# Patient Record
Sex: Female | Born: 1985
Health system: Southern US, Community
[De-identification: ages and names within clinical notes are randomized; demographics above are authoritative.]

## PROBLEM LIST (undated history)

## (undated) DIAGNOSIS — O321XX0 Maternal care for breech presentation, not applicable or unspecified: Secondary | ICD-10-CM

## (undated) DIAGNOSIS — R87629 Unspecified abnormal cytological findings in specimens from vagina: Secondary | ICD-10-CM

## (undated) DIAGNOSIS — D573 Sickle-cell trait: Secondary | ICD-10-CM

## (undated) DIAGNOSIS — F419 Anxiety disorder, unspecified: Secondary | ICD-10-CM

## (undated) HISTORY — PX: HERNIA REPAIR: SHX51

## (undated) HISTORY — PX: WISDOM TOOTH EXTRACTION: SHX21

## (undated) HISTORY — PX: COLPOSCOPY: SHX161

## (undated) HISTORY — PX: LEEP: SHX91

---

## 2016-10-02 DIAGNOSIS — Z3201 Encounter for pregnancy test, result positive: Secondary | ICD-10-CM | POA: Diagnosis not present

## 2016-11-11 DIAGNOSIS — Z3401 Encounter for supervision of normal first pregnancy, first trimester: Secondary | ICD-10-CM | POA: Diagnosis not present

## 2016-11-11 DIAGNOSIS — Z3689 Encounter for other specified antenatal screening: Secondary | ICD-10-CM | POA: Diagnosis not present

## 2016-11-11 DIAGNOSIS — Z113 Encounter for screening for infections with a predominantly sexual mode of transmission: Secondary | ICD-10-CM | POA: Diagnosis not present

## 2016-11-11 DIAGNOSIS — O3441 Maternal care for other abnormalities of cervix, first trimester: Secondary | ICD-10-CM | POA: Diagnosis not present

## 2016-11-11 LAB — OB RESULTS CONSOLE GC/CHLAMYDIA
Chlamydia: NEGATIVE
Gonorrhea: NEGATIVE

## 2016-11-11 LAB — OB RESULTS CONSOLE ABO/RH: RH Type: POSITIVE

## 2016-11-11 LAB — OB RESULTS CONSOLE HEPATITIS B SURFACE ANTIGEN: HEP B S AG: NEGATIVE

## 2016-11-11 LAB — OB RESULTS CONSOLE HIV ANTIBODY (ROUTINE TESTING): HIV: NONREACTIVE

## 2016-11-11 LAB — OB RESULTS CONSOLE RPR: RPR: NONREACTIVE

## 2016-11-11 LAB — OB RESULTS CONSOLE RUBELLA ANTIBODY, IGM: RUBELLA: IMMUNE

## 2016-11-11 LAB — OB RESULTS CONSOLE ANTIBODY SCREEN: Antibody Screen: NEGATIVE

## 2016-11-30 DIAGNOSIS — O3442 Maternal care for other abnormalities of cervix, second trimester: Secondary | ICD-10-CM | POA: Diagnosis not present

## 2016-11-30 DIAGNOSIS — Z3682 Encounter for antenatal screening for nuchal translucency: Secondary | ICD-10-CM | POA: Diagnosis not present

## 2016-11-30 DIAGNOSIS — Z3A13 13 weeks gestation of pregnancy: Secondary | ICD-10-CM | POA: Diagnosis not present

## 2016-11-30 DIAGNOSIS — Z9889 Other specified postprocedural states: Secondary | ICD-10-CM | POA: Diagnosis not present

## 2016-12-21 DIAGNOSIS — Z361 Encounter for antenatal screening for raised alphafetoprotein level: Secondary | ICD-10-CM | POA: Diagnosis not present

## 2016-12-21 DIAGNOSIS — Z3A16 16 weeks gestation of pregnancy: Secondary | ICD-10-CM | POA: Diagnosis not present

## 2016-12-21 DIAGNOSIS — O3442 Maternal care for other abnormalities of cervix, second trimester: Secondary | ICD-10-CM | POA: Diagnosis not present

## 2017-01-05 DIAGNOSIS — Z3A18 18 weeks gestation of pregnancy: Secondary | ICD-10-CM | POA: Diagnosis not present

## 2017-01-05 DIAGNOSIS — O26892 Other specified pregnancy related conditions, second trimester: Secondary | ICD-10-CM | POA: Diagnosis not present

## 2017-01-12 DIAGNOSIS — Z3686 Encounter for antenatal screening for cervical length: Secondary | ICD-10-CM | POA: Diagnosis not present

## 2017-01-12 DIAGNOSIS — O3442 Maternal care for other abnormalities of cervix, second trimester: Secondary | ICD-10-CM | POA: Diagnosis not present

## 2017-01-12 DIAGNOSIS — Z3A19 19 weeks gestation of pregnancy: Secondary | ICD-10-CM | POA: Diagnosis not present

## 2017-01-12 DIAGNOSIS — Z363 Encounter for antenatal screening for malformations: Secondary | ICD-10-CM | POA: Diagnosis not present

## 2017-02-10 DIAGNOSIS — O3442 Maternal care for other abnormalities of cervix, second trimester: Secondary | ICD-10-CM | POA: Diagnosis not present

## 2017-02-10 DIAGNOSIS — Z3A23 23 weeks gestation of pregnancy: Secondary | ICD-10-CM | POA: Diagnosis not present

## 2017-02-16 DIAGNOSIS — Z043 Encounter for examination and observation following other accident: Secondary | ICD-10-CM | POA: Diagnosis not present

## 2017-02-23 DIAGNOSIS — Z3A25 25 weeks gestation of pregnancy: Secondary | ICD-10-CM | POA: Diagnosis not present

## 2017-02-23 DIAGNOSIS — O3442 Maternal care for other abnormalities of cervix, second trimester: Secondary | ICD-10-CM | POA: Diagnosis not present

## 2017-03-03 DIAGNOSIS — O3442 Maternal care for other abnormalities of cervix, second trimester: Secondary | ICD-10-CM | POA: Diagnosis not present

## 2017-03-03 DIAGNOSIS — Z3A26 26 weeks gestation of pregnancy: Secondary | ICD-10-CM | POA: Diagnosis not present

## 2017-03-18 DIAGNOSIS — Z23 Encounter for immunization: Secondary | ICD-10-CM | POA: Diagnosis not present

## 2017-03-18 DIAGNOSIS — O3443 Maternal care for other abnormalities of cervix, third trimester: Secondary | ICD-10-CM | POA: Diagnosis not present

## 2017-03-18 DIAGNOSIS — Z3689 Encounter for other specified antenatal screening: Secondary | ICD-10-CM | POA: Diagnosis not present

## 2017-03-18 DIAGNOSIS — Z3A28 28 weeks gestation of pregnancy: Secondary | ICD-10-CM | POA: Diagnosis not present

## 2017-03-30 DIAGNOSIS — Z3A30 30 weeks gestation of pregnancy: Secondary | ICD-10-CM | POA: Diagnosis not present

## 2017-03-30 DIAGNOSIS — O3443 Maternal care for other abnormalities of cervix, third trimester: Secondary | ICD-10-CM | POA: Diagnosis not present

## 2017-04-14 DIAGNOSIS — O321XX0 Maternal care for breech presentation, not applicable or unspecified: Secondary | ICD-10-CM | POA: Diagnosis not present

## 2017-04-14 DIAGNOSIS — Z3A32 32 weeks gestation of pregnancy: Secondary | ICD-10-CM | POA: Diagnosis not present

## 2017-04-28 DIAGNOSIS — O321XX0 Maternal care for breech presentation, not applicable or unspecified: Secondary | ICD-10-CM | POA: Diagnosis not present

## 2017-04-28 DIAGNOSIS — Z3A34 34 weeks gestation of pregnancy: Secondary | ICD-10-CM | POA: Diagnosis not present

## 2017-05-17 DIAGNOSIS — Z3685 Encounter for antenatal screening for Streptococcus B: Secondary | ICD-10-CM | POA: Diagnosis not present

## 2017-05-17 DIAGNOSIS — Z3A37 37 weeks gestation of pregnancy: Secondary | ICD-10-CM | POA: Diagnosis not present

## 2017-05-17 DIAGNOSIS — O321XX0 Maternal care for breech presentation, not applicable or unspecified: Secondary | ICD-10-CM | POA: Diagnosis not present

## 2017-05-17 DIAGNOSIS — Z23 Encounter for immunization: Secondary | ICD-10-CM | POA: Diagnosis not present

## 2017-05-20 ENCOUNTER — Other Ambulatory Visit: Payer: Self-pay | Admitting: Obstetrics & Gynecology

## 2017-05-21 ENCOUNTER — Encounter (HOSPITAL_COMMUNITY): Payer: Self-pay | Admitting: *Deleted

## 2017-05-26 DIAGNOSIS — Z3A38 38 weeks gestation of pregnancy: Secondary | ICD-10-CM | POA: Diagnosis not present

## 2017-05-26 DIAGNOSIS — O321XX0 Maternal care for breech presentation, not applicable or unspecified: Secondary | ICD-10-CM | POA: Diagnosis not present

## 2017-05-28 ENCOUNTER — Encounter (HOSPITAL_COMMUNITY)
Admission: RE | Admit: 2017-05-28 | Discharge: 2017-05-28 | Disposition: A | Discharge status: Home / Self Care | Payer: Federal, State, Local not specified - PPO | Source: Ambulatory Visit | Attending: Obstetrics and Gynecology | Admitting: Obstetrics and Gynecology

## 2017-05-28 HISTORY — DX: Sickle-cell trait: D57.3

## 2017-05-28 HISTORY — DX: Unspecified abnormal cytological findings in specimens from vagina: R87.629

## 2017-05-31 ENCOUNTER — Encounter (HOSPITAL_COMMUNITY)
Admission: RE | Admit: 2017-05-31 | Discharge: 2017-05-31 | Disposition: A | Discharge status: Home / Self Care | Payer: Federal, State, Local not specified - PPO | Source: Ambulatory Visit | Attending: Obstetrics and Gynecology | Admitting: Obstetrics and Gynecology

## 2017-05-31 DIAGNOSIS — Z3A39 39 weeks gestation of pregnancy: Secondary | ICD-10-CM | POA: Diagnosis not present

## 2017-05-31 DIAGNOSIS — O9902 Anemia complicating childbirth: Secondary | ICD-10-CM | POA: Diagnosis not present

## 2017-05-31 DIAGNOSIS — D573 Sickle-cell trait: Secondary | ICD-10-CM | POA: Diagnosis not present

## 2017-05-31 DIAGNOSIS — O321XX0 Maternal care for breech presentation, not applicable or unspecified: Secondary | ICD-10-CM | POA: Diagnosis not present

## 2017-05-31 LAB — CBC
HEMATOCRIT: 36.2 % (ref 36.0–46.0)
HEMOGLOBIN: 13.8 g/dL (ref 12.0–15.0)
MCH: 31.5 pg (ref 26.0–34.0)
MCHC: 38.1 g/dL — AB (ref 30.0–36.0)
MCV: 82.6 fL (ref 78.0–100.0)
Platelets: 299 10*3/uL (ref 150–400)
RBC: 4.38 MIL/uL (ref 3.87–5.11)
RDW: 13 % (ref 11.5–15.5)
WBC: 11.4 10*3/uL — ABNORMAL HIGH (ref 4.0–10.5)

## 2017-05-31 LAB — ABO/RH: ABO/RH(D): B POS

## 2017-05-31 LAB — TYPE AND SCREEN
ABO/RH(D): B POS
Antibody Screen: NEGATIVE

## 2017-05-31 NOTE — H&P (Signed)
Denise Mcdaniel is a 31 y.o. female presenting for primary C-section for persistent Breech. 39.2 wks. Slight spotting 10/29. No other complaints.  Uncomplicated pregnancy, LEEP hx, cervix <3 cm at anatomy sono followed up and remained stable.  SS trait. FOB clear.   OB History    Gravida Para Term Preterm AB Living   3 0     2     SAB TAB Ectopic Multiple Live Births     2           Past Medical History:  Diagnosis Date  . Sickle cell trait syndrome (HCC)    is sickle cell S trait  . Vaginal Pap smear, abnormal    Past Surgical History:  Procedure Laterality Date  . HERNIA REPAIR    . LEEP    . WISDOM TOOTH EXTRACTION     Family History: family history includes Diabetes in her maternal grandfather and maternal grandmother. Social History:  reports that she has never smoked. She has never used smokeless tobacco. She reports that she does not drink alcohol or use drugs.     Maternal Diabetes: No Genetic Screening: Normal Maternal Ultrasounds/Referrals: Normal Fetal Ultrasounds or other Referrals:  None Maternal Substance Abuse:  No Significant Maternal Medications:  None Significant Maternal Lab Results:  Lab values include: Group B Strep negative Other Comments:  None  ROS History   There were no vitals taken for this visit. Exam Physical Exam  BP 127/83   Pulse 100   Temp 98.8 F (37.1 C) (Oral)   Resp 18   Ht 5\' 8"  (1.727 m)   Wt 178 lb (80.7 kg)   BMI 27.06 kg/m   Physical exam:  A&O x 3, no acute distress. Pleasant HEENT neg, no thyromegaly Lungs CTA bilat CV RRR, S1S2 normal Abdo soft, non tender, non acute Extr no edema/ tenderness Pelvic def FHT  140s Toco none  Prenatal labs: ABO, Rh: --/--/B POS (10/29 16100949) Antibody: NEG (10/29 [id number]) Rubella: Immune (04/11 0000) RPR: Nonreactive (04/11 0000)  HBsAg: Negative (04/11 0000)  HIV: Non-reactive (04/11 0000)  GBS:   negative  Assessment/Plan: 31 yo G3P0, 39.2 wks. Breech. Declines version.  Here for C-section Risks/complications of surgery reviewed incl infection, bleeding, damage to internal organs including bladder, bowels, ureters, blood vessels, other risks from anesthesia, VTE and delayed complications of any surgery, complications in future surgery reviewed. Also discussed neonatal complications incl difficult delivery, laceration, vacuum assistance, TTN etc. Pt understands and agrees, all concerns addressed.      Amel Kitch R 05/31/2017, 1:32 PM

## 2017-05-31 NOTE — Patient Instructions (Signed)
Lauretta Grillaylor H Dinino  05/31/2017   Your procedure is scheduled on:  10.30.2018  [facility] through the [facility]tr[facility] at 0900 AM.  Pick up the phone at the desk and dial [phone]  Call this number if you have problems the morning of surgery:[phone]  Remember:   Do not eat food:After Midnight.  Do not drink clear liquids: After Midnight.  Take these medicines the morning of surgery with A SIP OF WATER: none   Do not wear jewelry, make-up or nail polish.  Do not wear lotions, powders, or perfumes. Do not wear deodorant.  Do not shave 48 hours prior to surgery.  Do not bring valuables to the hospital.  [facility] is not   responsible for any belongings or valuables brought to the hospital.  Contacts, dentures or bridgework may not be worn into surgery.  Leave suitcase in the car. After surgery it may be brought to your room.  For patients admitted to the hospital, checkout time is 11:00 AM the day of              discharge.    N/A   Please read over the following fact sheets that you were given:   Surgical Site Infection Prevention

## 2017-06-01 ENCOUNTER — Encounter (HOSPITAL_COMMUNITY): Payer: Self-pay | Admitting: *Deleted

## 2017-06-01 ENCOUNTER — Inpatient Hospital Stay (HOSPITAL_COMMUNITY)
Admission: AD | Admit: 2017-06-01 | Discharge: 2017-06-04 | DRG: 788 | Disposition: A | Discharge status: Home / Self Care | Payer: Federal, State, Local not specified - PPO | Source: Ambulatory Visit | Attending: Obstetrics & Gynecology | Admitting: Obstetrics & Gynecology

## 2017-06-01 ENCOUNTER — Inpatient Hospital Stay (HOSPITAL_COMMUNITY): Payer: Federal, State, Local not specified - PPO

## 2017-06-01 ENCOUNTER — Encounter (HOSPITAL_COMMUNITY)
Admission: AD | Disposition: A | Discharge status: Home / Self Care | Payer: Self-pay | Source: Ambulatory Visit | Attending: Obstetrics & Gynecology

## 2017-06-01 DIAGNOSIS — D573 Sickle-cell trait: Secondary | ICD-10-CM | POA: Diagnosis present

## 2017-06-01 DIAGNOSIS — Z3A39 39 weeks gestation of pregnancy: Secondary | ICD-10-CM

## 2017-06-01 DIAGNOSIS — O9902 Anemia complicating childbirth: Secondary | ICD-10-CM | POA: Diagnosis present

## 2017-06-01 DIAGNOSIS — O321XX0 Maternal care for breech presentation, not applicable or unspecified: Secondary | ICD-10-CM | POA: Diagnosis not present

## 2017-06-01 HISTORY — DX: Maternal care for breech presentation, not applicable or unspecified: O32.1XX0

## 2017-06-01 LAB — RPR: RPR Ser Ql: NONREACTIVE

## 2017-06-01 SURGERY — Surgical Case
Anesthesia: Spinal

## 2017-06-01 MED ORDER — NALBUPHINE HCL 10 MG/ML IJ SOLN: 5.0000 mg | INTRAMUSCULAR | Status: DC | PRN

## 2017-06-01 MED ORDER — ONDANSETRON HCL 4 MG/2ML IJ SOLN
INTRAMUSCULAR | Status: AC
  Filled 2017-06-01: qty 2

## 2017-06-01 MED ORDER — LACTATED RINGERS IV SOLN: INTRAVENOUS | Status: DC

## 2017-06-01 MED ORDER — ZOLPIDEM TARTRATE 5 MG PO TABS: 5.0000 mg | ORAL_TABLET | Freq: Every evening | ORAL | Status: DC | PRN

## 2017-06-01 MED ORDER — ACETAMINOPHEN 325 MG PO TABS
650.0000 mg | ORAL_TABLET | ORAL | Status: DC | PRN
  Administered 2017-06-02 – 2017-06-04 (×5): 650 mg via ORAL
  Filled 2017-06-01 (×6): qty 2

## 2017-06-01 MED ORDER — NALBUPHINE HCL 10 MG/ML IJ SOLN: 5.0000 mg | Freq: Once | INTRAMUSCULAR | Status: DC | PRN

## 2017-06-01 MED ORDER — OXYTOCIN 10 UNIT/ML IJ SOLN
INTRAMUSCULAR | Status: AC
  Filled 2017-06-01: qty 4

## 2017-06-01 MED ORDER — BUPIVACAINE IN DEXTROSE 0.75-8.25 % IT SOLN
INTRATHECAL | Status: DC | PRN
  Administered 2017-06-01: 1.4 mL via INTRATHECAL

## 2017-06-01 MED ORDER — NALOXONE HCL 2 MG/2ML IJ SOSY
1.0000 ug/kg/h | PREFILLED_SYRINGE | INTRAVENOUS | Status: DC | PRN
  Filled 2017-06-01: qty 2

## 2017-06-01 MED ORDER — LACTATED RINGERS IV SOLN
INTRAVENOUS | Status: DC
  Administered 2017-06-01 (×4): via INTRAVENOUS

## 2017-06-01 MED ORDER — MEPERIDINE HCL 25 MG/ML IJ SOLN: 6.2500 mg | INTRAMUSCULAR | Status: DC | PRN

## 2017-06-01 MED ORDER — PHENYLEPHRINE 8 MG IN D5W 100 ML (0.08MG/ML) PREMIX OPTIME
INJECTION | INTRAVENOUS | Status: DC | PRN
  Administered 2017-06-01: 60 ug/min via INTRAVENOUS

## 2017-06-01 MED ORDER — GENTAMICIN SULFATE 40 MG/ML IJ SOLN
INTRAMUSCULAR | Status: AC
  Administered 2017-06-01: 100 mL via INTRAVENOUS
  Filled 2017-06-01: qty 8.75

## 2017-06-01 MED ORDER — NALOXONE HCL 0.4 MG/ML IJ SOLN: 0.4000 mg | INTRAMUSCULAR | Status: DC | PRN

## 2017-06-01 MED ORDER — SIMETHICONE 80 MG PO CHEW
80.0000 mg | CHEWABLE_TABLET | Freq: Three times a day (TID) | ORAL | Status: DC
  Administered 2017-06-01 – 2017-06-04 (×7): 80 mg via ORAL
  Filled 2017-06-01 (×7): qty 1

## 2017-06-01 MED ORDER — SCOPOLAMINE 1 MG/3DAYS TD PT72: 1.0000 | MEDICATED_PATCH | Freq: Once | TRANSDERMAL | Status: DC

## 2017-06-01 MED ORDER — FENTANYL CITRATE (PF) 100 MCG/2ML IJ SOLN
INTRAMUSCULAR | Status: DC | PRN
  Administered 2017-06-01: 20 ug via INTRATHECAL

## 2017-06-01 MED ORDER — DEXAMETHASONE SODIUM PHOSPHATE 4 MG/ML IJ SOLN
INTRAMUSCULAR | Status: AC
  Filled 2017-06-01: qty 1

## 2017-06-01 MED ORDER — DIPHENHYDRAMINE HCL 25 MG PO CAPS: 25.0000 mg | ORAL_CAPSULE | Freq: Four times a day (QID) | ORAL | Status: DC | PRN

## 2017-06-01 MED ORDER — KETOROLAC TROMETHAMINE 30 MG/ML IJ SOLN
INTRAMUSCULAR | Status: AC
  Administered 2017-06-01: 30 mg
  Filled 2017-06-01: qty 1

## 2017-06-01 MED ORDER — DIPHENHYDRAMINE HCL 25 MG PO CAPS: 25.0000 mg | ORAL_CAPSULE | ORAL | Status: DC | PRN

## 2017-06-01 MED ORDER — DEXAMETHASONE SODIUM PHOSPHATE 4 MG/ML IJ SOLN
INTRAMUSCULAR | Status: DC | PRN
  Administered 2017-06-01: 4 mg via INTRAVENOUS

## 2017-06-01 MED ORDER — MORPHINE SULFATE (PF) 0.5 MG/ML IJ SOLN
INTRAMUSCULAR | Status: DC | PRN
  Administered 2017-06-01: .2 ug via INTRATHECAL

## 2017-06-01 MED ORDER — HYDROMORPHONE HCL 1 MG/ML IJ SOLN: 0.2500 mg | INTRAMUSCULAR | Status: DC | PRN

## 2017-06-01 MED ORDER — SENNOSIDES-DOCUSATE SODIUM 8.6-50 MG PO TABS
2.0000 | ORAL_TABLET | ORAL | Status: DC
Start: 2017-06-02 — End: 2017-06-04
  Administered 2017-06-02 – 2017-06-03 (×3): 2 via ORAL
  Filled 2017-06-01 (×3): qty 2

## 2017-06-01 MED ORDER — BUPIVACAINE IN DEXTROSE 0.75-8.25 % IT SOLN
INTRATHECAL | Status: AC
  Filled 2017-06-01: qty 2

## 2017-06-01 MED ORDER — PRENATAL MULTIVITAMIN CH
1.0000 | ORAL_TABLET | Freq: Every day | ORAL | Status: DC
  Administered 2017-06-02 – 2017-06-04 (×3): 1 via ORAL
  Filled 2017-06-01 (×3): qty 1

## 2017-06-01 MED ORDER — PHENYLEPHRINE 8 MG IN D5W 100 ML (0.08MG/ML) PREMIX OPTIME
INJECTION | INTRAVENOUS | Status: AC
  Filled 2017-06-01: qty 100

## 2017-06-01 MED ORDER — LACTATED RINGERS IV SOLN
INTRAVENOUS | Status: DC | PRN
  Administered 2017-06-01: 12:00:00 via INTRAVENOUS

## 2017-06-01 MED ORDER — OXYCODONE HCL 5 MG PO TABS
5.0000 mg | ORAL_TABLET | ORAL | Status: DC | PRN
  Administered 2017-06-02 – 2017-06-04 (×4): 5 mg via ORAL
  Filled 2017-06-01 (×3): qty 1

## 2017-06-01 MED ORDER — MEPERIDINE HCL 25 MG/ML IJ SOLN
INTRAMUSCULAR | Status: AC
  Filled 2017-06-01: qty 1

## 2017-06-01 MED ORDER — SODIUM CHLORIDE 0.9% FLUSH: 3.0000 mL | INTRAVENOUS | Status: DC | PRN

## 2017-06-01 MED ORDER — COCONUT OIL OIL
1.0000 "application " | TOPICAL_OIL | Status: DC | PRN
  Administered 2017-06-02: 1 via TOPICAL
  Filled 2017-06-01: qty 120

## 2017-06-01 MED ORDER — DIBUCAINE 1 % RE OINT: 1.0000 "application " | TOPICAL_OINTMENT | RECTAL | Status: DC | PRN

## 2017-06-01 MED ORDER — OXYTOCIN 10 UNIT/ML IJ SOLN
INTRAVENOUS | Status: DC | PRN
  Administered 2017-06-01: 40 [IU] via INTRAVENOUS

## 2017-06-01 MED ORDER — DIPHENHYDRAMINE HCL 50 MG/ML IJ SOLN: 12.5000 mg | INTRAMUSCULAR | Status: DC | PRN

## 2017-06-01 MED ORDER — MENTHOL 3 MG MT LOZG: 1.0000 | LOZENGE | OROMUCOSAL | Status: DC | PRN

## 2017-06-01 MED ORDER — SIMETHICONE 80 MG PO CHEW
80.0000 mg | CHEWABLE_TABLET | ORAL | Status: DC
  Administered 2017-06-02 – 2017-06-03 (×3): 80 mg via ORAL
  Filled 2017-06-01 (×3): qty 1

## 2017-06-01 MED ORDER — KETOROLAC TROMETHAMINE 30 MG/ML IJ SOLN: 30.0000 mg | Freq: Four times a day (QID) | INTRAMUSCULAR | Status: AC | PRN

## 2017-06-01 MED ORDER — WITCH HAZEL-GLYCERIN EX PADS: 1.0000 "application " | MEDICATED_PAD | CUTANEOUS | Status: DC | PRN

## 2017-06-01 MED ORDER — OXYCODONE HCL 5 MG PO TABS
10.0000 mg | ORAL_TABLET | ORAL | Status: DC | PRN
  Administered 2017-06-02 – 2017-06-04 (×4): 10 mg via ORAL
  Filled 2017-06-01 (×5): qty 2

## 2017-06-01 MED ORDER — IBUPROFEN 600 MG PO TABS
600.0000 mg | ORAL_TABLET | Freq: Four times a day (QID) | ORAL | Status: DC
  Administered 2017-06-01 – 2017-06-04 (×12): 600 mg via ORAL
  Filled 2017-06-01 (×12): qty 1

## 2017-06-01 MED ORDER — ONDANSETRON HCL 4 MG/2ML IJ SOLN
INTRAMUSCULAR | Status: DC | PRN
  Administered 2017-06-01: 4 mg via INTRAVENOUS

## 2017-06-01 MED ORDER — KETOROLAC TROMETHAMINE 30 MG/ML IJ SOLN: 30.0000 mg | Freq: Once | INTRAMUSCULAR | Status: DC | PRN

## 2017-06-01 MED ORDER — FENTANYL CITRATE (PF) 100 MCG/2ML IJ SOLN
INTRAMUSCULAR | Status: AC
  Filled 2017-06-01: qty 2

## 2017-06-01 MED ORDER — ACETAMINOPHEN 500 MG PO TABS
1000.0000 mg | ORAL_TABLET | Freq: Four times a day (QID) | ORAL | Status: AC
  Administered 2017-06-01 – 2017-06-02 (×4): 1000 mg via ORAL
  Filled 2017-06-01 (×4): qty 2

## 2017-06-01 MED ORDER — MORPHINE SULFATE (PF) 0.5 MG/ML IJ SOLN
INTRAMUSCULAR | Status: AC
  Filled 2017-06-01: qty 10

## 2017-06-01 MED ORDER — SCOPOLAMINE 1 MG/3DAYS TD PT72
MEDICATED_PATCH | TRANSDERMAL | Status: DC | PRN
  Administered 2017-06-01: 1 via TRANSDERMAL

## 2017-06-01 MED ORDER — ONDANSETRON HCL 4 MG/2ML IJ SOLN: 4.0000 mg | Freq: Three times a day (TID) | INTRAMUSCULAR | Status: DC | PRN

## 2017-06-01 MED ORDER — MEPERIDINE HCL 25 MG/ML IJ SOLN
INTRAMUSCULAR | Status: DC | PRN
  Administered 2017-06-01: 6.25 mg via INTRAVENOUS

## 2017-06-01 MED ORDER — OXYTOCIN 40 UNITS IN LACTATED RINGERS INFUSION - SIMPLE MED: 2.5000 [IU]/h | INTRAVENOUS | Status: AC

## 2017-06-01 MED ORDER — SIMETHICONE 80 MG PO CHEW: 80.0000 mg | CHEWABLE_TABLET | ORAL | Status: DC | PRN

## 2017-06-01 MED ORDER — SCOPOLAMINE 1 MG/3DAYS TD PT72
MEDICATED_PATCH | TRANSDERMAL | Status: AC
  Filled 2017-06-01: qty 1

## 2017-06-01 MED ORDER — TETANUS-DIPHTH-ACELL PERTUSSIS 5-2.5-18.5 LF-MCG/0.5 IM SUSP: 0.5000 mL | Freq: Once | INTRAMUSCULAR | Status: DC

## 2017-06-01 SURGICAL SUPPLY — 37 items
BENZOIN TINCTURE PRP APPL 2/3 (GAUZE/BANDAGES/DRESSINGS) ×2 IMPLANT
CHLORAPREP W/TINT 26ML (MISCELLANEOUS) ×2 IMPLANT
CLAMP CORD UMBIL (MISCELLANEOUS) IMPLANT
CLOSURE STERI STRIP 1/2 X4 (GAUZE/BANDAGES/DRESSINGS) ×2 IMPLANT
CLOTH BEACON ORANGE TIMEOUT ST (SAFETY) ×2 IMPLANT
CONTAINER PREFILL 10% NBF 15ML (MISCELLANEOUS) IMPLANT
DRAPE C SECTION CLR SCREEN (DRAPES) ×2 IMPLANT
DRSG OPSITE POSTOP 4X10 (GAUZE/BANDAGES/DRESSINGS) ×2 IMPLANT
ELECT REM PT RETURN 9FT ADLT (ELECTROSURGICAL) ×2
ELECTRODE REM PT RTRN 9FT ADLT (ELECTROSURGICAL) ×1 IMPLANT
EXTRACTOR VACUUM KIWI (MISCELLANEOUS) IMPLANT
EXTRACTOR VACUUM M CUP 4 TUBE (SUCTIONS) IMPLANT
GLOVE BIO SURGEON STRL SZ7 (GLOVE) ×2 IMPLANT
GLOVE BIOGEL PI IND STRL 7.0 (GLOVE) ×3 IMPLANT
GLOVE BIOGEL PI INDICATOR 7.0 (GLOVE) ×3
GLOVE ECLIPSE 6.5 STRL STRAW (GLOVE) ×2 IMPLANT
GOWN STRL REUS W/TWL LRG LVL3 (GOWN DISPOSABLE) ×4 IMPLANT
GOWN SURGICAL LARGE (GOWNS) ×2 IMPLANT
KIT ABG SYR 3ML LUER SLIP (SYRINGE) IMPLANT
NEEDLE HYPO 25X5/8 SAFETYGLIDE (NEEDLE) IMPLANT
NS IRRIG 1000ML POUR BTL (IV SOLUTION) ×2 IMPLANT
PACK C SECTION WH (CUSTOM PROCEDURE TRAY) ×2 IMPLANT
PAD OB MATERNITY 4.3X12.25 (PERSONAL CARE ITEMS) ×2 IMPLANT
RTRCTR C-SECT PINK 25CM LRG (MISCELLANEOUS) IMPLANT
STRIP CLOSURE SKIN 1/2X4 (GAUZE/BANDAGES/DRESSINGS) IMPLANT
SUT MON AB-0 CT1 36 (SUTURE) ×4 IMPLANT
SUT PLAIN 0 NONE (SUTURE) IMPLANT
SUT PLAIN 2 0 (SUTURE)
SUT PLAIN ABS 2-0 CT1 27XMFL (SUTURE) IMPLANT
SUT VIC AB 0 CT1 27 (SUTURE) ×2
SUT VIC AB 0 CT1 27XBRD ANBCTR (SUTURE) ×2 IMPLANT
SUT VIC AB 2-0 CT1 27 (SUTURE) ×1
SUT VIC AB 2-0 CT1 TAPERPNT 27 (SUTURE) ×1 IMPLANT
SUT VIC AB 4-0 KS 27 (SUTURE) ×2 IMPLANT
SUT VICRYL 0 TIES 12 18 (SUTURE) IMPLANT
TOWEL OR 17X24 6PK STRL BLUE (TOWEL DISPOSABLE) ×2 IMPLANT
TRAY FOLEY BAG SILVER LF 14FR (SET/KITS/TRAYS/PACK) IMPLANT

## 2017-06-01 NOTE — Anesthesia Postprocedure Evaluation (Signed)
Anesthesia Post Note  Patient: Lauretta Grillaylor H Accardi  Procedure(s) Performed: Primary CESAREAN SECTION (N/A )     Patient location during evaluation: Mother Baby Anesthesia Type: Spinal Level of consciousness: awake and alert and oriented Pain management: satisfactory to patient Vital Signs Assessment: post-procedure vital signs reviewed and stable Respiratory status: respiratory function stable and spontaneous breathing Cardiovascular status: blood pressure returned to baseline Postop Assessment: no headache, no backache, spinal receding, patient able to bend at knees and adequate PO intake Anesthetic complications: no    Last Vitals:  Vitals:   06/01/17 1915 06/01/17 1920  BP: (!) 107/59 (!) 100/44  Pulse: 74 94  Resp: 20 18  Temp:    SpO2: 98%     Last Pain:  Vitals:   06/01/17 2100  TempSrc:   PainSc: 0-No pain   Pain Goal: Patients Stated Pain Goal: 0 (06/01/17 1345)               Jameis Newsham

## 2017-06-01 NOTE — Anesthesia Preprocedure Evaluation (Signed)
Anesthesia Evaluation  Patient identified by MRN, date of birth, ID band Patient awake    Reviewed: Allergy & Precautions, H&P , NPO status , Patient's Chart, lab work & pertinent test results  Airway Mallampati: I  TM Distance: >3 FB Neck ROM: full    Dental no notable dental hx. (+) Teeth Intact   Pulmonary neg pulmonary ROS,    Pulmonary exam normal breath sounds clear to auscultation       Cardiovascular negative cardio ROS Normal cardiovascular exam Rhythm:regular Rate:Normal     Neuro/Psych negative neurological ROS  negative psych ROS   GI/Hepatic negative GI ROS, Neg liver ROS,   Endo/Other  negative endocrine ROS  Renal/GU negative Renal ROS     Musculoskeletal   Abdominal Normal abdominal exam  (+)   Peds  Hematology negative hematology ROS (+)   Anesthesia Other Findings   Reproductive/Obstetrics (+) Pregnancy                             Anesthesia Physical Anesthesia Plan  ASA: II  Anesthesia Plan: Spinal   Post-op Pain Management:    Induction:   PONV Risk Score and Plan: 3 and Ondansetron, Dexamethasone, Midazolam and Scopolamine patch - Pre-op  Airway Management Planned: Natural Airway and Nasal Cannula  Additional Equipment:   Intra-op Plan:   Post-operative Plan:   Informed Consent: I have reviewed the patients History and Physical, chart, labs and discussed the procedure including the risks, benefits and alternatives for the proposed anesthesia with the patient or authorized representative who has indicated his/her understanding and acceptance.     Plan Discussed with: CRNA and Surgeon  Anesthesia Plan Comments:         Anesthesia Quick Evaluation

## 2017-06-01 NOTE — Anesthesia Procedure Notes (Addendum)
Spinal  Patient location during procedure: OR Start time: 06/01/2017 11:07 AM End time: 06/01/2017 11:11 AM Staffing Anesthesiologist: Leilani AbleHATCHETT, Bassam Dresch Performed: anesthesiologist  Preanesthetic Checklist Completed: patient identified, pre-op evaluation, timeout performed, IV checked, risks and benefits discussed and monitors and equipment checked Spinal Block Patient position: sitting Prep: site prepped and draped and DuraPrep Patient monitoring: continuous pulse ox and blood pressure Approach: midline Location: L3-4 Injection technique: single-shot Needle Needle type: Pencan  Needle gauge: 24 G Needle length: 10 cm Needle insertion depth: 7 cm Assessment Sensory level: T4

## 2017-06-01 NOTE — Transfer of Care (Signed)
Immediate Anesthesia Transfer of Care Note  Patient: Denise Mcdaniel  Procedure(s) Performed: Primary CESAREAN SECTION (N/A )  Patient Location: PACU  Anesthesia Type:Spinal  Level of Consciousness: awake, alert , oriented and patient cooperative  Airway & Oxygen Therapy: Patient Spontanous Breathing  Post-op Assessment: Report given to RN and Post -op Vital signs reviewed and stable  Post vital signs: Reviewed and stable  Last Vitals:  Vitals:   06/01/17 0923  BP: 127/83  Pulse: 100  Resp: 18  Temp: 37.1 C    Last Pain:  Vitals:   06/01/17 0923  TempSrc: Oral      Patients Stated Pain Goal: 4 (06/01/17 [id number])  Complications: No apparent anesthesia complications

## 2017-06-01 NOTE — Op Note (Signed)
06/01/2017  Cesarean Section Procedure Note Lauretta Grillaylor H Pardue   Indications: Breech Presentation  39.2 wks   Pre-operative Diagnosis: Homero FellersFrank Breech Presentation.   Post-operative Diagnosis: Same   Surgeon:  Shea EvansMody, Ionia Schey, MD   Assistants: Carlean JewsMeredith Sigmon, CNM   Anesthesia: spinal   Procedure Details:  The patient was seen in the Holding Room. The risks, benefits, complications, treatment options, and expected outcomes were discussed with the patient. The patient concurred with the proposed plan, giving informed consent. identified as Lauretta Grillaylor H Bittick and the procedure verified as C-Section Delivery. A Time Out was held and the above information confirmed.  After induction of anesthesia, the patient was draped and prepped in the usual sterile manner and foley was placed. Gentamicin/ Clindamycin given. A Pfannenstiel Incision was made and carried down through the subcutaneous tissue to the fascia. Fascial incision was made and extended transversely. The fascia was separated from the underlying rectus tissue superiorly and inferiorly. The peritoneum was identified and entered. Peritoneal incision was extended longitudinally. Alexis retractor placed. The utero-vesical peritoneal reflection was incised transversely and the bladder flap was bluntly freed from the lower uterine segment. A low transverse uterine incision was made and clear amniotic fluid drained. Baby was in [place] Breech presentation. Baby boy was delivered by complete breech extraction in stepwise fashion keeping back anterior and head flexed at 11.38 am with Apgar scores of 9 at one minute and 9 at five minutes. Delayed cord clamping done at 1 minute and baby handed to NICU team active and vigorous. Cord blood sent. The placenta was removed Intact and appeared normal. The uterine outline, tubes and ovaries appeared normal. The uterine incision was closed with running locked sutures of 0Monocryl followed by a second imbricating layer. Hemostasis  was observed. Alexis removed. Peritoneal closure done with 2-0 Vicryl. The fascia was then reapproximated with running sutures of 0Vicryl. The subcuticular closure was performed using 4-0Vicryl. Steristrips and honeycomb dressing placed.  Instrument, sponge, and needle counts were correct prior the abdominal closure and were correct at the conclusion of the case.   Findings: [facility] at 11.38 am with Apgars 9 and 9 at 1 and 5 minutes. Placenta, cord normal. Uterus, tubes, ovaries normal.    Estimated Blood Loss: 704 mL   Total IV Fluids:  2200 cc LR   Urine Output: 200 CC OF clear urine  Specimens: cord blood   Complications: no complications  Disposition: PACU - hemodynamically stable.   Maternal Condition: stable   Baby condition / location:  Couplet care / Skin to Skin  Attending Attestation: I performed the procedure.   Signed: Surgeon(s): Shea EvansMody, Chriselda Leppert, MD

## 2017-06-02 ENCOUNTER — Encounter (HOSPITAL_COMMUNITY): Payer: Self-pay | Admitting: Obstetrics & Gynecology

## 2017-06-02 LAB — CBC
HCT: 32.4 % — ABNORMAL LOW (ref 36.0–46.0)
HEMOGLOBIN: 12 g/dL (ref 12.0–15.0)
MCH: 30.6 pg (ref 26.0–34.0)
MCHC: 37 g/dL — AB (ref 30.0–36.0)
MCV: 82.7 fL (ref 78.0–100.0)
PLATELETS: 256 10*3/uL (ref 150–400)
RBC: 3.92 MIL/uL (ref 3.87–5.11)
RDW: 13.1 % (ref 11.5–15.5)
WBC: 13.1 10*3/uL — ABNORMAL HIGH (ref 4.0–10.5)

## 2017-06-02 NOTE — Progress Notes (Signed)
Post Partum Day 1 Subjective: no complaints, up ad lib, voiding, tolerating PO and + flatus  Objective: Blood pressure (!) 112/55, pulse (!) 58, temperature 97.7 F (36.5 C), temperature source Oral, resp. rate 16, height 5\' 8"  (1.727 m), weight 80.7 kg (178 lb), SpO2 98 %, unknown if currently breastfeeding.  Physical Exam:  General: alert, cooperative and appears stated age Lochia: appropriate Uterine Fundus: firm Incision: healing well, no significant drainage, no dehiscence DVT Evaluation: No evidence of DVT seen on physical exam. Negative Homan's sign.   Recent Labs  05/31/17 0949 06/02/17 0517  HGB 13.8 12.0  HCT 36.2 32.4*    Assessment/Plan: Stable POD 1 Advance diet OOB  Pain management Lactation consult   LOS: 1 day   Raidon Swanner J 06/02/2017, 8:18 AM

## 2017-06-03 ENCOUNTER — Encounter (HOSPITAL_COMMUNITY): Payer: Self-pay | Admitting: *Deleted

## 2017-06-03 NOTE — Lactation Note (Addendum)
This note was copied from a baby's chart. Lactation Consultation Note New mom has semi flat nipples, very short shaft w/positional striped tender nipples. Comfort gels given to wear after BF. Breast round and heavy. Noted a few tender knots to outer Rt. Breast. Taught mom to massage breast. Hand expression taught w/NO colostrum noted. Mom has coconut oil, encouraged to apply before feeding, not to wear w/comfort gels. Assisted baby in football position. Fitted mom w/#16 NS. Taught mom application, mom demonstrated application. Baby latched w/o difficulty.  Baby BF great for 20 min. Taught breast massage. Mom states she feels softer, LC doesn't feel difference. NS clean and dry. Nothing noted! No swallows heard. Baby had limp arm after feeding.  Fitted mom w/#20NS for mom to try. Appeared to be a little larger. Gave mom #21 flanges for pumping. Hand pump given to pre-pump and shells to wear in bra.  Baby has had adequate out put since birth. Since birth at 2640 hrs old, 8 voids and 3 stools. When had first weight baby had 4 voids and 2 stools w/6% weight loss.  In last 24 hrs, baby has had 5 voids and 2 stools. LC concerned w/no colostrum noted and weight loss, baby may need to be supplemented. Will assess next weight and discuss w/parents. Encouraged mom to relax and nap if possible.  Patient Name: Denise Mcdaniel Today's Date: 06/03/2017 Reason for consult: Initial assessment   Maternal Data Has patient been taught Hand Expression?: Yes Does the patient have breastfeeding experience prior to this delivery?: No  Feeding Feeding Type: Breast Fed Length of feed: 20 min  LATCH Score Latch: Grasps breast easily, tongue down, lips flanged, rhythmical sucking.  Audible Swallowing: None  Type of Nipple: Everted at rest and after stimulation  Comfort (Breast/Nipple): Filling, red/small blisters or bruises, mild/mod discomfort  Hold (Positioning): Assistance needed to correctly position infant at  breast and maintain latch.  LATCH Score: 6  Interventions Interventions: Breast feeding basics reviewed;Breast compression;Assisted with latch;Adjust position;Hand pump;Comfort gels;Skin to skin;Support pillows;DEBP;Breast massage;Position options;Hand express;Pre-pump if needed;Coconut oil;Shells  Lactation Tools Discussed/Used Tools: Shells;Pump;Flanges;Coconut oil;Comfort gels;Nipple Shields Nipple shield size: 16;20 Flange Size: 21 Shell Type: Inverted Breast pump type: Manual WIC Program: No Pump Review: Setup, frequency, and cleaning;Milk Storage Initiated by:: Peri JeffersonL. Elaina Cara RN IBCLC Date initiated:: 06/03/17   Consult Status Consult Status: Follow-up Date: 06/03/17 Follow-up type: In-patient    Charyl DancerCARVER, Denise Schuman G 06/03/2017, 3:36 AM

## 2017-06-03 NOTE — Lactation Note (Signed)
This note was copied from a baby's chart. Lactation Consultation Note: Mother just finished a feeding. Mother reports that she felt that infant has a good feeding. Mother confirms she felt strong tugging. Mother is using a #16 nipple shield with a #5 fr feeding tube under nipple shield. Mother reports that infant took 15 ml of formula. Mother is using supplemental guidelines. Advised parents to offer at least 18-25 ml.  Mother post pumping and reports that she is not getting anything. Mother was taught hand expression and observed multiple drops from each breast. Mother very excited to see colostrum.  Mothers nipple are very pink and tender. She has comfort gels. Recommend that she ask OB for Rx for APNO.  Reviewed application of nipple shield. Mother denies tight fit with #16 . Advised mother to continue to cue base feed infant and feed at least 8-12 times in 24 hours. Mother to continue to supplement and post pump for 15 mins. Mother receptive to all teaching.   Patient Name: Boy Laretta Alstromaylor Mcchristian [id number]'s Date: 06/03/2017 Reason for consult: Follow-up assessment   Maternal Data Has patient been taught Hand Expression?: Yes  Feeding Feeding Type: Breast Fed Length of feed: 15 min (per mother)  LATCH Score                   Interventions    Lactation Tools Discussed/Used Tools: 92F feeding tube / Syringe   Consult Status Consult Status: Follow-up Date: 06/04/17 Follow-up type: In-patient    Stevan BornKendrick, Kedar Sedano [facility] 06/03/2017, 3:41 PM

## 2017-06-03 NOTE — Plan of Care (Signed)
Problem: Pain Management: Goal: General experience of comfort will improve and pain level will decrease Discussed the importance of requesting pain medication before pain gets too intense. Discussed with patient that increased ambulation in hallway can help with pain control, however, she needs to be comfortable enough to get out of bed and move in order to increase activity.

## 2017-06-03 NOTE — Progress Notes (Signed)
PPD #2, 1' C/s for breech. BOY, circ on HOLD as baby lost >10% wt   Subjective: Postpartum Day 2. Primary Cesarean Delivery Patient reports incisional pain, + flatus and no problems voiding.  Bleeding ok. Tired. Stressed from needing to give formula.   Objective: Vital signs in last 24 hours: Temp:  [98.2 F (36.8 C)-98.7 F (37.1 C)] 98.7 F (37.1 C) (11/01 0529) Pulse Rate:  [64-68] 68 (11/01 0529) Resp:  [16-18] 18 (11/01 0529) BP: (106-126)/(49-70) 126/70 (11/01 0529) SpO2:  [99 %] 99 % (10/31 0900)  Physical Exam:  General: alert and cooperative Lochia: appropriate Uterine Fundus: firm Incision: healing well DVT Evaluation: No evidence of DVT seen on physical exam.  CBC Latest Ref Rng & Units 06/02/2017 05/31/2017  WBC 4.0 - 10.5 K/uL 13.1(H) 11.4(H)  Hemoglobin 12.0 - 15.0 g/dL 24.412.0 01.013.8  Hematocrit 27.236.0 - 46.0 % 32.4(L) 36.2  Platelets 150 - 400 K/uL 256 299   B(+) Rub Imm   Assessment/Plan: Status post Cesarean section. Doing well postoperatively.  Continue pain mngmt.  Discharge tomorrow.  Circ on HOLD until cleared by Nursery, I plan to do it tomorrow     Denise Mcdaniel R 06/03/2017, 8:34 AM

## 2017-06-03 NOTE — Lactation Note (Signed)
This note was copied from a baby's chart. Lactation Consultation Note Baby had 9% weight loss in 41 hours. Baby's having good urine output. Mom wearing NS, w/NOTHING in NS. DRY.  Discussed supplementing w/parents. Mom agreeing. 5 fr feeding tube and syring demonstrated, FOB pushed formula as baby BF. Baby tolerated well. Parents understand set up and clean up.  Mom shown how to use DEBP & how to disassemble, clean, & reassemble parts. Mom knows to pump q3h for 15-20 min. Mom used DEBP w/no collection of colostrum.  Mom upset that she may not have anything.   Parents state they have good understanding. Reported to RN and on coming LC. Patient Name: Denise Mcdaniel [id number]'ZToday's Date: 06/03/2017 Reason for consult: Follow-up assessment;Infant weight loss   Maternal Data    Feeding Feeding Type: Formula Length of feed: 16 min  LATCH Score Latch: Grasps breast easily, tongue down, lips flanged, rhythmical sucking.  Audible Swallowing: None  Type of Nipple: Everted at rest and after stimulation  Comfort (Breast/Nipple): Filling, red/small blisters or bruises, mild/mod discomfort  Hold (Positioning): Assistance needed to correctly position infant at breast and maintain latch.  LATCH Score: 6  Interventions Interventions: Shells;Hand express;Position options;Breast massage;Skin to skin;Support pillows;DEBP;Hand pump;Adjust position;Assisted with latch;Breast feeding basics reviewed;Breast compression;Comfort gels  Lactation Tools Discussed/Used Tools: 8F feeding tube / Syringe;Shells;Pump;Flanges;Nipple Shields Nipple shield size: 16 Flange Size: 21 Shell Type: Inverted Breast pump type: Double-Electric Breast Pump   Consult Status Consult Status: Follow-up Date: 06/04/17 Follow-up type: In-patient    Charyl DancerCARVER, Denise Mcdaniel 06/03/2017, 7:13 AM

## 2017-06-04 MED ORDER — IBUPROFEN 600 MG PO TABS: 600.0000 mg | ORAL_TABLET | Freq: Four times a day (QID) | ORAL | 0 refills | Status: DC

## 2017-06-04 MED ORDER — OXYCODONE-ACETAMINOPHEN 5-325 MG PO TABS: 1.0000 | ORAL_TABLET | Freq: Four times a day (QID) | ORAL | 0 refills | Status: DC | PRN

## 2017-06-04 NOTE — Progress Notes (Signed)
Discharge teaching complete with pt. Pt understood all information and did not have any questions. 

## 2017-06-04 NOTE — Lactation Note (Signed)
This note was copied from a baby's chart. Lactation Consultation Note Just a brief f/u to check on mom if she has any questions or concerns. Mom stated she is following the plan and starting to see colostrum.  Asked mom to try to BF w/#20NS for next feeding to check for comfort, if not comfortable then cont. To use #16 NS.  Mom appears to be calm. Baby resting in FOB arms.  Patient Name: Denise Mcdaniel ZOXWR'UToday's Date: 06/04/2017 Reason for consult: Follow-up assessment   Maternal Data    Feeding Feeding Type: Breast Milk with Formula added Length of feed: 25 min  LATCH Score                   Interventions    Lactation Tools Discussed/Used Tools: 72F feeding tube / Syringe Nipple shield size: 16 Breast pump type: Double-Electric Breast Pump   Consult Status Consult Status: Follow-up Date: 06/04/17 Follow-up type: In-patient    Charyl DancerCARVER, Yvette Loveless G 06/04/2017, 12:39 AM

## 2017-06-04 NOTE — Lactation Note (Signed)
This note was copied from a baby's chart. Lactation Consultation Note: Mother paged for latch assist. Infant is 72 hours and has had a gain of 2 ounces. Parents are supplementing infant with formula . Mother reports that her nipples are slightly sore and feel much better than yesterday. Nipple tissue intact. Slight redness noted.  Assist mother with firming nipple and hand expressing colostrum to the tip of the nipple. #5 fr feeding tube used at the breast without the nipple shield. Mother complaints of slight discomfort. Parents taught how to roll infants lip and do chin tug to widen infants gape. Infant was given 19 ml of formula at the breast. Assist mother with latching infant on the alternate breast in cross cradle hold. Infant latch very quickly and sustained latch with only breast compression. Observed multiple swallows. Mother denies pain and reports the latch feels much better.  Advised mother to firm nipple before latch and use off sided latch technique. Mother very receptive to all teach. Advised mother to attempt to breast feed infant without the shield and if unable to sustain latch without pain, use the nipple shield. Reviewed proper application of the shield again. Mother to continue to post pump every 2-3 hours. Mother has Medela pump at home. [facility]. Mother is aware of available LC services and has phone number for questions or concerns.   Mother pumped 6 ml with last pumping. Advised to use ebm first and then formula for remainder of feeding.    Patient Name: Denise Mcdaniel'UToday's Date: 06/04/2017 Reason for consult: Follow-up assessment   Maternal Data    Feeding Feeding Type: Breast Fed Length of feed: 15 min (observed good strong tugging )  LATCH Score Latch: Grasps breast easily, tongue down, lips flanged, rhythmical sucking.  Audible Swallowing: Spontaneous and intermittent  Type of Nipple:  Everted at rest and after stimulation  Comfort (Breast/Nipple): Filling, red/small blisters or bruises, mild/mod discomfort  Hold (Positioning): Assistance needed to correctly position infant at breast and maintain latch.  LATCH Score: 8  Interventions    Lactation Tools Discussed/Used Tools: 59F feeding tube / Syringe (with out the nipple shield)   Consult Status Consult Status: Follow-up Date:  (office to phone mother for appt) Follow-up type: Out-patient    Stevan BornKendrick, Lavinia [facility]neely [facility] 06/04/2017, 12:17 PM

## 2017-06-04 NOTE — Discharge Summary (Signed)
OB Discharge Summary  Patient Name: Denise Mcdaniel DOB: 1986/02/09 MRN: [id number]  Date of admission: 06/01/2017 Delivering MD: MODY, VAISHALI   Date of discharge: 06/04/2017  Admitting diagnosis: PREG Breech Presentation Intrauterine pregnancy: 9113w2d     Secondary diagnosis:Principal Problem:   Postpartum care following cesarean delivery (10/30) Active Problems:   Homero FellersFrank breech   Cesarean delivery delivered  Additional problems:none     Discharge diagnosis: Term Pregnancy Delivered                                                                     Post partum procedures:none  Augmentation: none  Complications: None  Hospital course:  Sceduled C/S   31 y.o. yo [id number] at 2413w2d was admitted to the hospital 06/01/2017 for scheduled cesarean section with the following indication:Malpresentation.  Membrane Rupture Time/Date: 11:37 AM ,06/01/2017   Patient delivered a Viable infant.06/01/2017  Details of operation can be found in separate operative note.  Pateint had an uncomplicated postpartum course.  She is ambulating, tolerating a regular diet, passing flatus, and urinating well. Patient is discharged home in stable condition on  06/04/17         Physical exam  Vitals:   06/02/17 1700 06/03/17 0529 06/03/17 1755 06/04/17 0550  BP: 110/64 126/70 110/73 119/70  Pulse: 67 68 66 71  Resp: 18 18 18 18   Temp: 98.3 F (36.8 C) 98.7 F (37.1 C) 98.7 F (37.1 C) 98.9 F (37.2 C)  TempSrc: Oral Oral Oral Oral  SpO2:    98%  Weight:      Height:       General: alert Lochia: appropriate Uterine Fundus: firm Incision: Healing well with no significant drainage, Dressing is clean, dry, and intact DVT Evaluation: No evidence of DVT seen on physical exam. Labs: Lab Results  Component Value Date   WBC 13.1 (H) 06/02/2017   HGB 12.0 06/02/2017   HCT 32.4 (L) 06/02/2017   MCV 82.7 06/02/2017   PLT 256 06/02/2017   No flowsheet data found.  Discharge instruction:  per After Visit Summary and "Baby and Me Booklet".  After Visit Meds:  Allergies as of 06/04/2017      Reactions   Sulfur Anaphylaxis   Phenergan [promethazine Hcl]    Dystonia loss of facial muscles   Penicillins Rash      Medication List    TAKE these medications   acetaminophen 500 MG tablet Commonly known as:  TYLENOL Take 1,000 mg by mouth every 6 (six) hours as needed for moderate pain or headache.   doxylamine (Sleep) 25 MG tablet Commonly known as:  UNISOM Take 25 mg by mouth at bedtime.   ibuprofen 600 MG tablet Commonly known as:  ADVIL,MOTRIN Take 1 tablet (600 mg total) by mouth every 6 (six) hours.   oxyCODONE-acetaminophen 5-325 MG tablet Commonly known as:  ROXICET Take 1-2 tablets by mouth every 6 (six) hours as needed for severe pain.   PRENATAL PO Take 1 tablet by mouth daily.       Diet: routine diet  Activity: Advance as tolerated. Pelvic rest for 6 weeks.   Outpatient follow up:6 weeks Follow up Appt:No future appointments. Follow up visit: No Follow-up on file.  Postpartum contraception:  Not Discussed  Newborn Data: Live born female  Birth Weight: 6 lb 8.9 oz (2975 g) APGAR: 9, 9  Newborn Delivery   Birth date/time:  06/01/2017 11:38:00 Delivery type:  C-Section, Low Transverse  C-section categorization:  Primary     Baby Feeding: Breast Disposition:home with mother   06/04/2017 Lendon Colonel., MD

## 2017-06-17 DIAGNOSIS — R21 Rash and other nonspecific skin eruption: Secondary | ICD-10-CM | POA: Diagnosis not present

## 2017-06-29 DIAGNOSIS — L509 Urticaria, unspecified: Secondary | ICD-10-CM | POA: Diagnosis not present

## 2017-06-29 DIAGNOSIS — R21 Rash and other nonspecific skin eruption: Secondary | ICD-10-CM | POA: Diagnosis not present

## 2018-09-02 DIAGNOSIS — Z1151 Encounter for screening for human papillomavirus (HPV): Secondary | ICD-10-CM | POA: Diagnosis not present

## 2018-09-02 DIAGNOSIS — Z6821 Body mass index (BMI) 21.0-21.9, adult: Secondary | ICD-10-CM | POA: Diagnosis not present

## 2018-09-02 DIAGNOSIS — Z01419 Encounter for gynecological examination (general) (routine) without abnormal findings: Secondary | ICD-10-CM | POA: Diagnosis not present

## 2019-06-14 DIAGNOSIS — F411 Generalized anxiety disorder: Secondary | ICD-10-CM | POA: Diagnosis not present

## 2019-06-21 DIAGNOSIS — F411 Generalized anxiety disorder: Secondary | ICD-10-CM | POA: Diagnosis not present

## 2019-06-27 DIAGNOSIS — F411 Generalized anxiety disorder: Secondary | ICD-10-CM | POA: Diagnosis not present

## 2019-07-05 DIAGNOSIS — F411 Generalized anxiety disorder: Secondary | ICD-10-CM | POA: Diagnosis not present

## 2019-07-14 DIAGNOSIS — F411 Generalized anxiety disorder: Secondary | ICD-10-CM | POA: Diagnosis not present

## 2019-07-20 DIAGNOSIS — F411 Generalized anxiety disorder: Secondary | ICD-10-CM | POA: Diagnosis not present

## 2019-07-31 DIAGNOSIS — F411 Generalized anxiety disorder: Secondary | ICD-10-CM | POA: Diagnosis not present

## 2019-08-17 DIAGNOSIS — F411 Generalized anxiety disorder: Secondary | ICD-10-CM | POA: Diagnosis not present

## 2019-09-13 ENCOUNTER — Ambulatory Visit: Payer: Federal, State, Local not specified - PPO | Attending: Internal Medicine

## 2019-09-13 DIAGNOSIS — Z20822 Contact with and (suspected) exposure to covid-19: Secondary | ICD-10-CM

## 2019-09-14 LAB — NOVEL CORONAVIRUS, NAA: SARS-CoV-2, NAA: NOT DETECTED

## 2019-10-25 DIAGNOSIS — Z6822 Body mass index (BMI) 22.0-22.9, adult: Secondary | ICD-10-CM | POA: Diagnosis not present

## 2019-10-25 DIAGNOSIS — Z01419 Encounter for gynecological examination (general) (routine) without abnormal findings: Secondary | ICD-10-CM | POA: Diagnosis not present

## 2020-02-09 DIAGNOSIS — Z20822 Contact with and (suspected) exposure to covid-19: Secondary | ICD-10-CM | POA: Diagnosis not present

## 2020-02-09 DIAGNOSIS — Z03818 Encounter for observation for suspected exposure to other biological agents ruled out: Secondary | ICD-10-CM | POA: Diagnosis not present

## 2020-02-11 ENCOUNTER — Ambulatory Visit: Admission: EM | Admit: 2020-02-11 | Payer: Federal, State, Local not specified - PPO

## 2020-02-11 ENCOUNTER — Telehealth: Payer: Self-pay | Admitting: Physician Assistant

## 2020-02-11 ENCOUNTER — Inpatient Hospital Stay (HOSPITAL_COMMUNITY): Payer: Federal, State, Local not specified - PPO

## 2020-02-11 ENCOUNTER — Other Ambulatory Visit: Payer: Self-pay

## 2020-02-11 ENCOUNTER — Encounter (HOSPITAL_COMMUNITY): Payer: Self-pay

## 2020-02-11 ENCOUNTER — Inpatient Hospital Stay (HOSPITAL_COMMUNITY)
Admission: EM | Admit: 2020-02-11 | Discharge: 2020-02-11 | Disposition: A | Discharge status: Home / Self Care | Payer: Federal, State, Local not specified - PPO | Attending: Obstetrics & Gynecology | Admitting: Obstetrics & Gynecology

## 2020-02-11 DIAGNOSIS — Z79899 Other long term (current) drug therapy: Secondary | ICD-10-CM | POA: Diagnosis not present

## 2020-02-11 DIAGNOSIS — Z791 Long term (current) use of non-steroidal anti-inflammatories (NSAID): Secondary | ICD-10-CM | POA: Diagnosis not present

## 2020-02-11 DIAGNOSIS — O26891 Other specified pregnancy related conditions, first trimester: Secondary | ICD-10-CM | POA: Insufficient documentation

## 2020-02-11 DIAGNOSIS — Z3A01 Less than 8 weeks gestation of pregnancy: Secondary | ICD-10-CM

## 2020-02-11 DIAGNOSIS — Z888 Allergy status to other drugs, medicaments and biological substances status: Secondary | ICD-10-CM | POA: Insufficient documentation

## 2020-02-11 DIAGNOSIS — O3680X0 Pregnancy with inconclusive fetal viability, not applicable or unspecified: Secondary | ICD-10-CM

## 2020-02-11 DIAGNOSIS — O209 Hemorrhage in early pregnancy, unspecified: Secondary | ICD-10-CM | POA: Diagnosis not present

## 2020-02-11 DIAGNOSIS — Z882 Allergy status to sulfonamides status: Secondary | ICD-10-CM | POA: Insufficient documentation

## 2020-02-11 DIAGNOSIS — R109 Unspecified abdominal pain: Secondary | ICD-10-CM | POA: Diagnosis not present

## 2020-02-11 DIAGNOSIS — D573 Sickle-cell trait: Secondary | ICD-10-CM | POA: Diagnosis not present

## 2020-02-11 DIAGNOSIS — Z880 Allergy status to penicillin: Secondary | ICD-10-CM | POA: Insufficient documentation

## 2020-02-11 LAB — WET PREP, GENITAL
Clue Cells Wet Prep HPF POC: NONE SEEN
Sperm: NONE SEEN
Trich, Wet Prep: NONE SEEN
Yeast Wet Prep HPF POC: NONE SEEN

## 2020-02-11 LAB — I-STAT BETA HCG BLOOD, ED (MC, WL, AP ONLY): I-stat hCG, quantitative: 17.3 m[IU]/mL — ABNORMAL HIGH (ref <5)

## 2020-02-11 LAB — CBC
HCT: 35.9 % — ABNORMAL LOW (ref 36.0–46.0)
Hemoglobin: 12.8 g/dL (ref 12.0–15.0)
MCH: 29.2 pg (ref 26.0–34.0)
MCHC: 35.7 g/dL (ref 30.0–36.0)
MCV: 81.8 fL (ref 80.0–100.0)
Platelets: 305 10*3/uL (ref 150–400)
RBC: 4.39 MIL/uL (ref 3.87–5.11)
RDW: 12.2 % (ref 11.5–15.5)
WBC: 7.4 10*3/uL (ref 4.0–10.5)
nRBC: 0 % (ref 0.0–0.2)

## 2020-02-11 LAB — I-STAT CHEM 8, ED
BUN: 10 mg/dL (ref 6–20)
Calcium, Ion: 1.24 mmol/L (ref 1.15–1.40)
Chloride: 104 mmol/L (ref 98–111)
Creatinine, Ser: 0.6 mg/dL (ref 0.44–1.00)
Glucose, Bld: 124 mg/dL — ABNORMAL HIGH (ref 70–99)
HCT: 39 % (ref 36.0–46.0)
Hemoglobin: 13.3 g/dL (ref 12.0–15.0)
Potassium: 3.8 mmol/L (ref 3.5–5.1)
Sodium: 142 mmol/L (ref 135–145)
TCO2: 25 mmol/L (ref 22–32)

## 2020-02-11 LAB — URINALYSIS, ROUTINE W REFLEX MICROSCOPIC
Bacteria, UA: NONE SEEN
Bilirubin Urine: NEGATIVE
Glucose, UA: NEGATIVE mg/dL
Ketones, ur: NEGATIVE mg/dL
Leukocytes,Ua: NEGATIVE
Nitrite: NEGATIVE
Protein, ur: NEGATIVE mg/dL
Specific Gravity, Urine: 1.011 (ref 1.005–1.030)
pH: 6 (ref 5.0–8.0)

## 2020-02-11 NOTE — ED Notes (Signed)
Report called to [facility] in [facility].  Transport here to take pt over.

## 2020-02-11 NOTE — Telephone Encounter (Signed)
I called and spoke with the patient regarding her pending visit to the UC.  She was advised to go to the [facility] ED for evaluation of her concerns due to the urgency of her symptoms.  She was informed that we are unable to do any imaging studies or follow up care.  Patient verbalized understanding and agreement to this plan.

## 2020-02-11 NOTE — ED Provider Notes (Signed)
Patient placed in Quick Look pathway, seen and evaluated   Chief Complaint: vaginal bleeding  HPI:   34 yo female G4P1 presents to emergency department today with chief complaint of vaginal bleeding x1 day.  Patient states she had a home positive pregnancy test x 5 days ago.  She reports last night she started to have vaginal spotting.  She did not have any associated abdominal pain.  When she woke up today she had heavy vaginal bleeding.  She describes the bleeding as bright red in color.  She states she has been passing stringy clots.  She denies any fever, chills, chest pain or shortness of breath, abdominal pain, pelvic pain.  No medications for symptoms prior to arrival.  ROS: + Vaginal bleeding, -nausea, -emesis, -fever, -chills  Physical Exam:   Gen: No distress  Neuro: Awake and Alert  Skin: Warm    Focused Exam: Abdomen is nontender, not distended.  No tenderness to palpation.  No peritoneal signs.  No rigidity or guarding.   Results for orders placed or performed during the hospital encounter of 02/11/20 (from the past 24 hour(s))  I-stat chem 8, ed     Status: Abnormal   Collection Time: 02/11/20 10:27 AM  Result Value Ref Range   Sodium 142 135 - 145 mmol/L   Potassium 3.8 3.5 - 5.1 mmol/L   Chloride 104 98 - 111 mmol/L   BUN 10 6 - 20 mg/dL   Creatinine, Ser 8.18 0.44 - 1.00 mg/dL   Glucose, Bld 299 (H) 70 - 99 mg/dL   Calcium, Ion 3.71 6.96 - 1.40 mmol/L   TCO2 25 22 - 32 mmol/L   Hemoglobin 13.3 12.0 - 15.0 g/dL   HCT 78.9 36 - 46 %  I-Stat beta hCG blood, ED     Status: Abnormal   Collection Time: 02/11/20 10:54 AM  Result Value Ref Range   I-stat hCG, quantitative 17.3 (H) <5 mIU/mL   Comment 3             Initiation of care has begun. The patient has been counseled on the process, plan, and necessity for staying for the completion/evaluation, and the remainder of the medical screening examination. Beta hCG positive at 17.3. Case discussed with [facility]U APP who  accepts patient in transfer. Chem 8 also collected in triage, no severe electrolyte derangements, no renal insufficiency.    Portions of this note were generated with Scientist, clinical (histocompatibility and immunogenetics). Dictation errors may occur despite best attempts at proofreading.   Britt Bolognese, Caroleen Hamman, PA-C 02/11/20 1135    Derwood Kaplan, MD 02/11/20 2124

## 2020-02-11 NOTE — Discharge Instructions (Signed)
Safe Medications in Pregnancy  ° °Acne: °Benzoyl Peroxide °Salicylic Acid ° °Backache/Headache: °Tylenol: 2 regular strength every 4 hours OR °             2 Extra strength every 6 hours ° °Colds/Coughs/Allergies: °Benadryl (alcohol free) 25 mg every 6 hours as needed °Breath right strips °Claritin °Cepacol throat lozenges °Chloraseptic throat spray °Cold-Eeze- up to three times per day °Cough drops, alcohol free °Flonase (by prescription only) °Guaifenesin °Mucinex °Robitussin DM (plain only, alcohol free) °Saline nasal spray/drops °Sudafed (pseudoephedrine) & Actifed ** use only after [redacted] weeks gestation and if you do not have high blood pressure °Tylenol °Vicks Vaporub °Zinc lozenges °Zyrtec  ° °Constipation: °Colace °Ducolax suppositories °Fleet enema °Glycerin suppositories °Metamucil °Milk of magnesia °Miralax °Senokot °Smooth move tea ° °Diarrhea: °Kaopectate °Imodium A-D ° °*NO pepto Bismol ° °Hemorrhoids: °Anusol °Anusol HC °Preparation H °Tucks ° °Indigestion: °Tums °Maalox °Mylanta °Zantac  °Pepcid ° °Insomnia: °Benadryl (alcohol free) 25mg every 6 hours as needed °Tylenol PM °Unisom, no Gelcaps ° °Leg Cramps: °Tums °MagGel ° °Nausea/Vomiting:  °Bonine °Dramamine °Emetrol °Ginger extract °Sea bands °Meclizine  °Nausea medication to take during pregnancy:  °Unisom (doxylamine succinate 25 mg tablets) Take one tablet daily at bedtime. If symptoms are not adequately controlled, the dose can be increased to a maximum recommended dose of two tablets daily (1/2 tablet in the morning, 1/2 tablet mid-afternoon and one at bedtime). °Vitamin B6 100mg tablets. Take one tablet twice a day (up to 200 mg per day). ° °Skin Rashes: °Aveeno products °Benadryl cream or 25mg every 6 hours as needed °Calamine Lotion °1% cortisone cream ° °Yeast infection: °Gyne-lotrimin 7 °Monistat 7 ° ° °**If taking multiple medications, please check labels to avoid duplicating the same active ingredients °**take medication as directed on  the label °** Do not exceed 4000 mg of tylenol in 24 hours °**Do not take medications that contain aspirin or ibuprofen ° ° ° ° °First Trimester of Pregnancy °The first trimester of pregnancy is from week 1 until the end of week 13 (months 1 through 3). A week after a sperm fertilizes an egg, the egg will implant on the wall of the uterus. This embryo will begin to develop into a baby. Genes from you and your partner will form the baby. The female genes will determine whether the baby will be a boy or a girl. At 6-8 weeks, the eyes and face will be formed, and the heartbeat can be seen on ultrasound. At the end of 12 weeks, all the baby's organs will be formed. °Now that you are pregnant, you will want to do everything you can to have a healthy baby. Two of the most important things are to get good prenatal care and to follow your health care provider's instructions. Prenatal care is all the medical care you receive before the baby's birth. This care will help prevent, find, and treat any problems during the pregnancy and childbirth. °Body changes during your first trimester °Your body goes through many changes during pregnancy. The changes vary from woman to woman. °· You may gain or lose a couple of pounds at first. °· You may feel sick to your stomach (nauseous) and you may throw up (vomit). If the vomiting is uncontrollable, call your health care provider. °· You may tire easily. °· You may develop headaches that can be relieved by medicines. All medicines should be approved by your health care provider. °· You may urinate more often. Painful urination may mean you have   a bladder infection. °· You may develop heartburn as a result of your pregnancy. °· You may develop constipation because certain hormones are causing the muscles that push stool through your intestines to slow down. °· You may develop hemorrhoids or swollen veins (varicose veins). °· Your breasts may begin to grow larger and become tender. Your  nipples may stick out more, and the tissue that surrounds them (areola) may become darker. °· Your gums may bleed and may be sensitive to brushing and flossing. °· Dark spots or blotches (chloasma, mask of pregnancy) may develop on your face. This will likely fade after the baby is born. °· Your menstrual periods will stop. °· You may have a loss of appetite. °· You may develop cravings for certain kinds of food. °· You may have changes in your emotions from day to day, such as being excited to be pregnant or being concerned that something may go wrong with the pregnancy and baby. °· You may have more vivid and strange dreams. °· You may have changes in your hair. These can include thickening of your hair, rapid growth, and changes in texture. Some women also have hair loss during or after pregnancy, or hair that feels dry or thin. Your hair will most likely return to normal after your baby is born. °What to expect at prenatal visits °During a routine prenatal visit: °· You will be weighed to make sure you and the baby are growing normally. °· Your blood pressure will be taken. °· Your abdomen will be measured to track your baby's growth. °· The fetal heartbeat will be listened to between weeks 10 and 14 of your pregnancy. °· Test results from any previous visits will be discussed. °Your health care provider may ask you: °· How you are feeling. °· If you are feeling the baby move. °· If you have had any abnormal symptoms, such as leaking fluid, bleeding, severe headaches, or abdominal cramping. °· If you are using any tobacco products, including cigarettes, chewing tobacco, and electronic cigarettes. °· If you have any questions. °Other tests that may be performed during your first trimester include: °· Blood tests to find your blood type and to check for the presence of any previous infections. The tests will also be used to check for low iron levels (anemia) and protein on red blood cells (Rh antibodies).  Depending on your risk factors, or if you previously had diabetes during pregnancy, you may have tests to check for high blood sugar that affects pregnant women (gestational diabetes). °· Urine tests to check for infections, diabetes, or protein in the urine. °· An ultrasound to confirm the proper growth and development of the baby. °· Fetal screens for spinal cord problems (spina bifida) and Down syndrome. °· HIV (human immunodeficiency virus) testing. Routine prenatal testing includes screening for HIV, unless you choose not to have this test. °· You may need other tests to make sure you and the baby are doing well. °Follow these instructions at home: °Medicines °· Follow your health care provider's instructions regarding medicine use. Specific medicines may be either safe or unsafe to take during pregnancy. °· Take a prenatal vitamin that contains at least 600 micrograms (mcg) of folic acid. °· If you develop constipation, try taking a stool softener if your health care provider approves. °Eating and drinking ° °· Eat a balanced diet that includes fresh fruits and vegetables, whole grains, good sources of protein such as meat, eggs, or tofu, and low-fat dairy. Your health   care provider will help you determine the amount of weight gain that is right for you.  Avoid raw meat and uncooked cheese. These carry germs that can cause birth defects in the baby.  Eating four or five small meals rather than three large meals a day may help relieve nausea and vomiting. If you start to feel nauseous, eating a few soda crackers can be helpful. Drinking liquids between meals, instead of during meals, also seems to help ease nausea and vomiting.  Limit foods that are high in fat and processed sugars, such as fried and sweet foods.  To prevent constipation: ? Eat foods that are high in fiber, such as fresh fruits and vegetables, whole grains, and beans. ? Drink enough fluid to keep your urine clear or pale  yellow. Activity  Exercise only as directed by your health care provider. Most women can continue their usual exercise routine during pregnancy. Try to exercise for 30 minutes at least 5 days a week. Exercising will help you: ? Control your weight. ? Stay in shape. ? Be prepared for labor and delivery.  Experiencing pain or cramping in the lower abdomen or lower back is a good sign that you should stop exercising. Check with your health care provider before continuing with normal exercises.  Try to avoid standing for long periods of time. Move your legs often if you must stand in one place for a long time.  Avoid heavy lifting.  Wear low-heeled shoes and practice good posture.  You may continue to have sex unless your health care provider tells you not to. Relieving pain and discomfort  Wear a good support bra to relieve breast tenderness.  Take warm sitz baths to soothe any pain or discomfort caused by hemorrhoids. Use hemorrhoid cream if your health care provider approves.  Rest with your legs elevated if you have leg cramps or low back pain.  If you develop varicose veins in your legs, wear support hose. Elevate your feet for 15 minutes, 3-4 times a day. Limit salt in your diet. Prenatal care  Schedule your prenatal visits by the twelfth week of pregnancy. They are usually scheduled monthly at first, then more often in the last 2 months before delivery.  Write down your questions. Take them to your prenatal visits.  Keep all your prenatal visits as told by your health care provider. This is important. Safety  Wear your seat belt at all times when driving.  Make a list of emergency phone numbers, including numbers for family, friends, the hospital, and police and fire departments. General instructions  Ask your health care provider for a referral to a local prenatal education class. Begin classes no later than the beginning of month 6 of your pregnancy.  Ask for help if  you have counseling or nutritional needs during pregnancy. Your health care provider can offer advice or refer you to specialists for help with various needs.  Do not use hot tubs, steam rooms, or saunas.  Do not douche or use tampons or scented sanitary pads.  Do not cross your legs for long periods of time.  Avoid cat litter boxes and soil used by cats. These carry germs that can cause birth defects in the baby and possibly loss of the fetus by miscarriage or stillbirth.  Avoid all smoking, herbs, alcohol, and medicines not prescribed by your health care provider. Chemicals in these products affect the formation and growth of the baby.  Do not use any products that contain nicotine  or tobacco, such as cigarettes and e-cigarettes. If you need help quitting, ask your health care provider. You may receive counseling support and other resources to help you quit. °· Schedule a dentist appointment. At home, brush your teeth with a soft toothbrush and be gentle when you floss. °Contact a health care provider if: °· You have dizziness. °· You have mild pelvic cramps, pelvic pressure, or nagging pain in the abdominal area. °· You have persistent nausea, vomiting, or diarrhea. °· You have a bad smelling vaginal discharge. °· You have pain when you urinate. °· You notice increased swelling in your face, hands, legs, or ankles. °· You are exposed to fifth disease or chickenpox. °· You are exposed to German measles (rubella) and have never had it. °Get help right away if: °· You have a fever. °· You are leaking fluid from your vagina. °· You have spotting or bleeding from your vagina. °· You have severe abdominal cramping or pain. °· You have rapid weight gain or loss. °· You vomit blood or material that looks like coffee grounds. °· You develop a severe headache. °· You have shortness of breath. °· You have any kind of trauma, such as from a fall or a car accident. °Summary °· The first trimester of pregnancy is  from week 1 until the end of week 13 (months 1 through 3). °· Your body goes through many changes during pregnancy. The changes vary from woman to woman. °· You will have routine prenatal visits. During those visits, your health care provider will examine you, discuss any test results you may have, and talk with you about how you are feeling. °This information is not intended to replace advice given to you by your health care provider. Make sure you discuss any questions you have with your health care provider. °Document Revised: 07/02/2017 Document Reviewed: 07/01/2016 °Elsevier Patient Education © 2020 [employer] ° °

## 2020-02-11 NOTE — ED Triage Notes (Signed)
Patient complains of vaginal bleeding with abdominal cramping that started lat night, had positive preg test at home. NAD

## 2020-02-11 NOTE — ED Notes (Signed)
PA at triage for [facility] for [facility].

## 2020-02-11 NOTE — MAU Provider Note (Signed)
History     CSN: [id number]  Arrival date and time: 02/11/20 1142   First Provider Initiated Contact with Patient 02/11/20 1234      Chief Complaint  Patient presents with  . Abdominal Pain  . Vaginal Bleeding   HPI BIRTTANY DECHELLIS is a 34 y.o. [phone] at [redacted]w[redacted]d who presents with vaginal bleeding. She states she started having spotting last night that became heavier today. She states it is more like a period now. She reports intermittent abdominal cramping. She reports a positive HPT on Tuesday. She has not been seen anywhere during this pregnancy but has an appointment tomorrow with [facility], last seen in the office in February.  OB History    Gravida  4   Para  1   Term  1   Preterm      AB  2   Living  1     SAB      TAB  2   Ectopic      Multiple  0   Live Births  1           Past Medical History:  Diagnosis Date  . Frank breech 06/01/2017  . Sickle cell trait syndrome (HCC)    is sickle cell S trait  . Vaginal Pap smear, abnormal     Past Surgical History:  Procedure Laterality Date  . CESAREAN SECTION N/A 06/01/2017   Procedure: Primary CESAREAN SECTION;  Surgeon: Shea Evans, MD;  Location: [facility] BIRTHING SUITES;  Service: Obstetrics;  Laterality: N/A;  EDD: 06/06/17 Allergy: Sulfa, Phenergan, PCN  . HERNIA REPAIR    . LEEP    . WISDOM TOOTH EXTRACTION      Family History  Problem Relation Age of Onset  . Diabetes Maternal Grandmother   . Diabetes Maternal Grandfather     Social History   Tobacco Use  . Smoking status: Never Smoker  . Smokeless tobacco: Never Used  Substance Use Topics  . Alcohol use: No  . Drug use: No    Allergies:  Allergies  Allergen Reactions  . Sulfur Anaphylaxis  . Phenergan [Promethazine Hcl]     Dystonia loss of facial muscles  . Penicillins Rash    Medications Prior to Admission  Medication Sig Dispense Refill Last Dose  . acetaminophen (TYLENOL) 500 MG tablet Take 1,000 mg by mouth every  6 (six) hours as needed for moderate pain or headache.     . doxylamine, Sleep, (UNISOM) 25 MG tablet Take 25 mg by mouth at bedtime.     Marland Kitchen ibuprofen (ADVIL,MOTRIN) 600 MG tablet Take 1 tablet (600 mg total) by mouth every 6 (six) hours. 60 tablet 0   . oxyCODONE-acetaminophen (ROXICET) 5-325 MG tablet Take 1-2 tablets by mouth every 6 (six) hours as needed for severe pain. 30 tablet 0   . Prenatal Vit-Fe Fumarate-FA (PRENATAL PO) Take 1 tablet by mouth daily.       Review of Systems  Constitutional: Negative.  Negative for fatigue and fever.  HENT: Negative.   Respiratory: Negative.  Negative for shortness of breath.   Cardiovascular: Negative.  Negative for chest pain.  Gastrointestinal: Positive for abdominal pain. Negative for constipation, diarrhea, nausea and vomiting.  Genitourinary: Positive for vaginal bleeding. Negative for dysuria and vaginal discharge.  Neurological: Negative.  Negative for dizziness and headaches.   Physical Exam   Blood pressure 119/85, pulse 73, temperature 98.6 F (37 C), temperature source Oral, resp. rate 16, height 5' 8.5" (1.74 m), weight  66.1 kg, last menstrual period 01/10/2020, SpO2 100 %, unknown if currently breastfeeding.  Physical Exam Vitals and nursing note reviewed.  Constitutional:      General: She is not in acute distress.    Appearance: She is well-developed.  HENT:     Head: Normocephalic.  Eyes:     Pupils: Pupils are equal, round, and reactive to light.  Cardiovascular:     Rate and Rhythm: Normal rate and regular rhythm.     Heart sounds: Normal heart sounds.  Pulmonary:     Effort: Pulmonary effort is normal. No respiratory distress.     Breath sounds: Normal breath sounds.  Abdominal:     General: Bowel sounds are normal. There is no distension.     Palpations: Abdomen is soft.     Tenderness: There is no abdominal tenderness.  Genitourinary:    Comments: Pelvic exam: Cervix pink, visually closed, without lesion, scant  white creamy discharge, vaginal walls and external genitalia normal,   Small amount of bright red bleeding in vault  Bimanual exam: Cervix 0/long/high, firm, anterior, neg CMT, uterus nontender, adnexa without tenderness, enlargement, or mass  Skin:    General: Skin is warm and dry.  Neurological:     Mental Status: She is alert and oriented to person, place, and time.  Psychiatric:        Behavior: Behavior normal.        Thought Content: Thought content normal.        Judgment: Judgment normal.     MAU Course  Procedures Results for orders placed or performed during the hospital encounter of 02/11/20 (from the past 24 hour(s))  I-stat chem 8, ed     Status: Abnormal   Collection Time: 02/11/20 10:27 AM  Result Value Ref Range   Sodium 142 135 - 145 mmol/L   Potassium 3.8 3.5 - 5.1 mmol/L   Chloride 104 98 - 111 mmol/L   BUN 10 6 - 20 mg/dL   Creatinine, Ser 7.41 0.44 - 1.00 mg/dL   Glucose, Bld 287 (H) 70 - 99 mg/dL   Calcium, Ion 8.67 6.72 - 1.40 mmol/L   TCO2 25 22 - 32 mmol/L   Hemoglobin 13.3 12.0 - 15.0 g/dL   HCT 09.4 36 - 46 %  I-Stat beta hCG blood, ED     Status: Abnormal   Collection Time: 02/11/20 10:54 AM  Result Value Ref Range   I-stat hCG, quantitative 17.3 (H) <5 mIU/mL   Comment 3          Urinalysis, Routine w reflex microscopic     Status: Abnormal   Collection Time: 02/11/20 12:29 PM  Result Value Ref Range   Color, Urine YELLOW YELLOW   APPearance CLEAR CLEAR   Specific Gravity, Urine 1.011 1.005 - 1.030   pH 6.0 5.0 - 8.0   Glucose, UA NEGATIVE NEGATIVE mg/dL   Hgb urine dipstick MODERATE (A) NEGATIVE   Bilirubin Urine NEGATIVE NEGATIVE   Ketones, ur NEGATIVE NEGATIVE mg/dL   Protein, ur NEGATIVE NEGATIVE mg/dL   Nitrite NEGATIVE NEGATIVE   Leukocytes,Ua NEGATIVE NEGATIVE   RBC / HPF 0-5 0 - 5 RBC/hpf   WBC, UA 0-5 0 - 5 WBC/hpf   Bacteria, UA NONE SEEN NONE SEEN   Squamous Epithelial / LPF 0-5 0 - 5   Mucus PRESENT   Wet prep, genital      Status: Abnormal   Collection Time: 02/11/20 12:45 PM  Result Value Ref Range   Yeast  Wet Prep HPF POC NONE SEEN NONE SEEN   Trich, Wet Prep NONE SEEN NONE SEEN   Clue Cells Wet Prep HPF POC NONE SEEN NONE SEEN   WBC, Wet Prep HPF POC MODERATE (A) NONE SEEN   Sperm NONE SEEN   CBC     Status: Abnormal   Collection Time: 02/11/20 12:54 PM  Result Value Ref Range   WBC 7.4 4.0 - 10.5 K/uL   RBC 4.39 3.87 - 5.11 MIL/uL   Hemoglobin 12.8 12.0 - 15.0 g/dL   HCT 77.8 (L) 36 - 46 %   MCV 81.8 80.0 - 100.0 fL   MCH 29.2 26.0 - 34.0 pg   MCHC 35.7 30.0 - 36.0 g/dL   RDW 24.2 35.3 - 61.4 %   Platelets 305 150 - 400 K/uL   nRBC 0.0 0.0 - 0.2 %   US OB LESS THAN 14 WEEKS WITH OB TRANSVAGINAL  Result Date: 02/11/2020 CLINICAL DATA:  34 year old female with positive pregnancy test and vaginal bleeding. Estimated gestational age of [redacted] weeks 4 days by LMP. EXAM: OBSTETRIC <14 WK [place] AND TRANSVAGINAL OB US TECHNIQUE: Both transabdominal and transvaginal ultrasound examinations were performed for complete evaluation of the gestation as well as the maternal uterus, adnexal regions, and pelvic cul-de-sac. Transvaginal technique was performed to assess early pregnancy. COMPARISON:  None. FINDINGS: Intrauterine gestational sac: None Yolk sac:  Not Visualized. Embryo:  Not Visualized. Maternal uterus/adnexae: The ovaries bilaterally are unremarkable. No adnexal mass or free fluid. IMPRESSION: No IUP or adnexal mass visualized. Differential diagnosis includes recent spontaneous abortion, IUP too early to visualize, and occult ectopic pregnancy. Recommend close follow up of quantitative B-HCG levels, and follow up US as clinically warranted. Electronically Signed   By: Harmon Pier M.D.   On: 02/11/2020 13:59   MDM UA, UPT CBC, HCG ABO/Rh- B Pos Wet prep and gc/chlamydia US OB Comp Less 14 weeks with Transvaginal  Discussed with client the diagnosis of pregnancy of unknown anatomic location.  Three  possibilities of outcome are: a healthy pregnancy that is too early to see a yolk sac to confirm the pregnancy is in the uterus, a pregnancy that is not healthy and has not developed and will not develop, and an ectopic pregnancy that is in the abdomen that cannot be identified at this time.  And ectopic pregnancy can be a life threatening situation as a pregnancy needs to be in the uterus which is a muscle and can stretch to accommodate the growth of a pregnancy.  Other structures in the pelvis and abdomen as not muscular and do not stretch with the growth of a pregnancy.  Worst case scenario is that a structure ruptures with a growing pregnancy not in the uterus and and internal hemorrhage can be a life threatening situation.  We need to follow the progression of this pregnancy carefully.  We need to check another serum pregnancy hormone level to determine if the levels are rising appropriately  and to determine the next steps that are needed for you. Patient's questions were answered.  1406: Dr. Cherly Hensen notified of patient's diagnosis of pregnancy of unknown location and need for follow up HCG on 7/13. Message left with [facility] requesting follow up appointment also.   Assessment and Plan   1. Pregnancy of unknown anatomic location   2. Vaginal bleeding affecting early pregnancy   3. [redacted] weeks gestation of pregnancy    -Discharge home in stable condition -Strict ectopic precautions discussed -Patient advised to  follow-up with [facility] on Tuesday 7/13 for labs -Patient may return to MAU as needed or if her condition were to change or worsen   Rolm BookbinderCaroline M Denym Christenberry CNM 02/11/2020, 12:34 PM

## 2020-02-11 NOTE — MAU Note (Signed)
Denise Mcdaniel is a 34 y.o. at [redacted]w[redacted]d here in MAU reporting: + UPT on Tuesday. Started having vaginal bleeding last night and this AM bleeding is a little bit heavier. Also having some cramping.   LMP: 01/10/20  Onset of complaint: yesterday  Pain score: 7/10  Vitals:   02/11/20 0933 02/11/20 1157  BP: 133/83 119/85  Pulse: (!) 102 73  Resp: 18 16  Temp: 98.9 F (37.2 C) 98.6 F (37 C)  SpO2: 98% 100%     Lab orders placed from triage: UA

## 2020-02-12 LAB — GC/CHLAMYDIA PROBE AMP (~~LOC~~) NOT AT ARMC
Chlamydia: NEGATIVE
Comment: NEGATIVE
Comment: NORMAL
Neisseria Gonorrhea: NEGATIVE

## 2020-02-13 DIAGNOSIS — Z3A01 Less than 8 weeks gestation of pregnancy: Secondary | ICD-10-CM | POA: Diagnosis not present

## 2020-02-13 DIAGNOSIS — O209 Hemorrhage in early pregnancy, unspecified: Secondary | ICD-10-CM | POA: Diagnosis not present

## 2021-01-31 LAB — OB RESULTS CONSOLE GC/CHLAMYDIA
Chlamydia: NEGATIVE
Gonorrhea: NEGATIVE

## 2021-01-31 LAB — OB RESULTS CONSOLE ANTIBODY SCREEN: Antibody Screen: NEGATIVE

## 2021-01-31 LAB — OB RESULTS CONSOLE RUBELLA ANTIBODY, IGM: Rubella: IMMUNE

## 2021-01-31 LAB — OB RESULTS CONSOLE HIV ANTIBODY (ROUTINE TESTING): HIV: NONREACTIVE

## 2021-01-31 LAB — OB RESULTS CONSOLE ABO/RH: RH Type: POSITIVE

## 2021-01-31 LAB — OB RESULTS CONSOLE HEPATITIS B SURFACE ANTIGEN: Hepatitis B Surface Ag: NEGATIVE

## 2021-01-31 LAB — OB RESULTS CONSOLE RPR: RPR: NONREACTIVE

## 2021-01-31 LAB — OB RESULTS CONSOLE VARICELLA ZOSTER ANTIBODY, IGG: Varicella: IMMUNE

## 2021-07-24 ENCOUNTER — Other Ambulatory Visit: Payer: Self-pay | Admitting: Obstetrics & Gynecology

## 2021-08-07 LAB — OB RESULTS CONSOLE GBS: GBS: NEGATIVE

## 2021-08-11 ENCOUNTER — Encounter (HOSPITAL_COMMUNITY): Payer: Self-pay

## 2021-08-11 ENCOUNTER — Encounter (HOSPITAL_COMMUNITY): Payer: Self-pay | Admitting: *Deleted

## 2021-08-11 NOTE — Patient Instructions (Addendum)
Denise Mcdaniel  08/11/2021   Your procedure is scheduled on:  08/20/2021  Arrive at 49 at Entrance C on [address] at [facility]  and [employer] [employer] [address]. You are invited to use the FREE valet parking or use the Visitor's parking deck.  Pick up the phone at the desk and dial [phone].  Call this number if you have problems the morning of surgery: [phone]  Remember:   Do not eat food:(After Midnight) Desps de medianoche.  Do not drink clear liquids: (After Midnight) Desps de medianoche.  Take these medicines the morning of surgery with A SIP OF WATER:  none   Do not wear jewelry, make-up or nail polish.  Do not wear lotions, powders, or perfumes. Do not wear deodorant.  Do not shave 48 hours prior to surgery.  Do not bring valuables to the hospital.  [facility] is not   responsible for any belongings or valuables brought to the hospital.  Contacts, dentures or bridgework may not be worn into surgery.  Leave suitcase in the car. After surgery it may be brought to your room.  For patients admitted to the hospital, checkout time is 11:00 AM the day of              discharge.      Please read over the following fact sheets that you were given:     Preparing for Surgery

## 2021-08-15 ENCOUNTER — Encounter (HOSPITAL_COMMUNITY): Payer: Self-pay | Admitting: Obstetrics & Gynecology

## 2021-08-18 ENCOUNTER — Other Ambulatory Visit: Payer: Self-pay | Admitting: Obstetrics & Gynecology

## 2021-08-18 ENCOUNTER — Other Ambulatory Visit (HOSPITAL_COMMUNITY)
Admission: RE | Admit: 2021-08-18 | Discharge: 2021-08-18 | Disposition: A | Discharge status: Home / Self Care | Payer: Federal, State, Local not specified - PPO | Source: Ambulatory Visit | Attending: Obstetrics & Gynecology | Admitting: Obstetrics & Gynecology

## 2021-08-18 ENCOUNTER — Other Ambulatory Visit: Payer: Self-pay

## 2021-08-18 DIAGNOSIS — Z01812 Encounter for preprocedural laboratory examination: Secondary | ICD-10-CM | POA: Insufficient documentation

## 2021-08-18 LAB — CBC
HCT: 35 % — ABNORMAL LOW (ref 36.0–46.0)
Hemoglobin: 12.3 g/dL (ref 12.0–15.0)
MCH: 28.4 pg (ref 26.0–34.0)
MCHC: 35.1 g/dL (ref 30.0–36.0)
MCV: 80.8 fL (ref 80.0–100.0)
Platelets: 332 10*3/uL (ref 150–400)
RBC: 4.33 MIL/uL (ref 3.87–5.11)
RDW: 13.2 % (ref 11.5–15.5)
WBC: 11.4 10*3/uL — ABNORMAL HIGH (ref 4.0–10.5)
nRBC: 0 % (ref 0.0–0.2)

## 2021-08-18 LAB — TYPE AND SCREEN
ABO/RH(D): B POS
Antibody Screen: NEGATIVE

## 2021-08-18 LAB — SARS CORONAVIRUS 2 (TAT 6-24 HRS): SARS Coronavirus 2: NEGATIVE

## 2021-08-19 ENCOUNTER — Encounter (HOSPITAL_COMMUNITY): Payer: Self-pay | Admitting: Obstetrics & Gynecology

## 2021-08-19 LAB — RPR: RPR Ser Ql: NONREACTIVE

## 2021-08-19 NOTE — Anesthesia Preprocedure Evaluation (Addendum)
Anesthesia Evaluation  Patient identified by MRN, date of birth, ID band Patient awake    Reviewed: Allergy & Precautions, NPO status , Patient's Chart, lab work & pertinent test results  Airway Mallampati: II  TM Distance: >3 FB Neck ROM: Full    Dental no notable dental hx. (+) Teeth Intact, Dental Advisory Given   Pulmonary neg pulmonary ROS,    Pulmonary exam normal breath sounds clear to auscultation       Cardiovascular negative cardio ROS Normal cardiovascular exam Rhythm:Regular Rate:Normal     Neuro/Psych Anxiety negative neurological ROS     GI/Hepatic Neg liver ROS, GERD  ,  Endo/Other  Obesity  Renal/GU negative Renal ROS  negative genitourinary   Musculoskeletal negative musculoskeletal ROS (+)   Abdominal   Peds  Hematology  (+) Sickle cell trait and anemia ,   Anesthesia Other Findings   Reproductive/Obstetrics (+) Pregnancy Hx/o previous C/Section                            Anesthesia Physical Anesthesia Plan  ASA: 2  Anesthesia Plan: Spinal   Post-op Pain Management:    Induction: Intravenous  PONV Risk Score and Plan: 4 or greater and Scopolamine patch - Pre-op and Treatment may vary due to age or medical condition  Airway Management Planned: Natural Airway  Additional Equipment:   Intra-op Plan:   Post-operative Plan:   Informed Consent: I have reviewed the patients History and Physical, chart, labs and discussed the procedure including the risks, benefits and alternatives for the proposed anesthesia with the patient or authorized representative who has indicated his/her understanding and acceptance.     Dental advisory given  Plan Discussed with: CRNA and Anesthesiologist  Anesthesia Plan Comments:        Anesthesia Quick Evaluation

## 2021-08-19 NOTE — H&P (Addendum)
Denise Mcdaniel is a 36 y.o. female presenting for repeat C/section. 39.2 wks.  [id number], H/o one C/s for breech. Now desires repeat C/s declines TOLAC.  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  SS trait  PUPP since 31 wks, better w/ topical meds. H/o severe PUPP postpartum last pregnancy.  Growth AGA at 36 wks, Vx  OB History     Gravida  5   Para  1   Term  1   Preterm      AB  3   Living  1      SAB  1   IAB  2   Ectopic      Multiple  0   Live Births  1          Past Medical History:  Diagnosis Date   Anxiety    Frank breech 06/01/2017   Sickle cell trait syndrome ([place])    is sickle cell S trait   Vaginal Pap smear, abnormal    Past Surgical History:  Procedure Laterality Date   CESAREAN SECTION N/A 06/01/2017   Procedure: Primary CESAREAN SECTION;  Surgeon: Azucena Fallen, MD;  Location: [place];  Service: Obstetrics;  Laterality: N/A;  EDD: 06/06/17 Allergy: Sulfa, Phenergan, PCN   COLPOSCOPY     HERNIA REPAIR     LEEP     WISDOM TOOTH EXTRACTION     Family History: family history includes Diabetes in her maternal grandfather and maternal grandmother. Social History:  reports that she has never smoked. She has never used smokeless tobacco. She reports that she does not drink alcohol and does not use drugs.     Maternal Diabetes: No Genetic Screening: Normal Maternal Ultrasounds/Referrals: Normal Fetal Ultrasounds or other Referrals:  None Maternal Substance Abuse:  No Significant Maternal Medications:  None Significant Maternal Lab Results:  GBS(-) Other Comments:  None  Review of Systems History   unknown if currently breastfeeding. Exam Physical Exam   A&O x 3, no acute distress. Pleasant HEENT neg, no thyromegaly Lungs CTA bilat CV RRR, S1S2 normal Abdo soft, non tender, non acute Extr no edema/ tenderness Pelvic cx 1-2 cm/ soft /-4 vx FHT  140s Toco none  Prenatal labs: ABO, Rh: --/--/B POS  (01/16 1030) Antibody: NEG (01/16 1030) Rubella: Immune (07/01 0000) RPR: NON REACTIVE (01/16 1100)  HBsAg: Negative (07/01 0000)  HIV: Non-reactive (07/01 0000)  GBS: Negative/-- (01/05 0000)  Glucola neg  Assessment/Plan: 36 yo G5P1031, at 39.2 wks, here for RC/s Risks/complications of surgery reviewed incl infection, bleeding, damage to internal organs including bladder, bowels, ureters, blood vessels, other risks from anesthesia, VTE and delayed complications of any surgery, complications in future surgery reviewed. Also discussed neonatal complications incl difficult delivery, laceration, vacuum assistance, TTN etc. Pt understands and agrees, all concerns addressed.    Denise Mcdaniel

## 2021-08-20 ENCOUNTER — Encounter (HOSPITAL_COMMUNITY): Payer: Self-pay | Admitting: Obstetrics & Gynecology

## 2021-08-20 ENCOUNTER — Other Ambulatory Visit: Payer: Self-pay

## 2021-08-20 ENCOUNTER — Inpatient Hospital Stay (HOSPITAL_COMMUNITY): Payer: Federal, State, Local not specified - PPO | Admitting: Anesthesiology

## 2021-08-20 ENCOUNTER — Encounter (HOSPITAL_COMMUNITY)
Admission: RE | Disposition: A | Discharge status: Home / Self Care | Payer: Self-pay | Source: Home / Self Care | Attending: Obstetrics & Gynecology

## 2021-08-20 ENCOUNTER — Inpatient Hospital Stay (HOSPITAL_COMMUNITY)
Admission: RE | Admit: 2021-08-20 | Discharge: 2021-08-22 | DRG: 788 | Disposition: A | Discharge status: Home / Self Care | Payer: Federal, State, Local not specified - PPO | Attending: Obstetrics & Gynecology | Admitting: Obstetrics & Gynecology

## 2021-08-20 DIAGNOSIS — Z98891 History of uterine scar from previous surgery: Secondary | ICD-10-CM

## 2021-08-20 DIAGNOSIS — O9081 Anemia of the puerperium: Secondary | ICD-10-CM | POA: Diagnosis not present

## 2021-08-20 DIAGNOSIS — Z880 Allergy status to penicillin: Secondary | ICD-10-CM | POA: Diagnosis not present

## 2021-08-20 DIAGNOSIS — O34211 Maternal care for low transverse scar from previous cesarean delivery: Principal | ICD-10-CM | POA: Diagnosis present

## 2021-08-20 DIAGNOSIS — D573 Sickle-cell trait: Secondary | ICD-10-CM | POA: Diagnosis present

## 2021-08-20 DIAGNOSIS — Z3A39 39 weeks gestation of pregnancy: Secondary | ICD-10-CM

## 2021-08-20 DIAGNOSIS — O99214 Obesity complicating childbirth: Secondary | ICD-10-CM | POA: Diagnosis present

## 2021-08-20 HISTORY — DX: Anxiety disorder, unspecified: F41.9

## 2021-08-20 SURGERY — Surgical Case
Anesthesia: Spinal | Wound class: Clean Contaminated

## 2021-08-20 MED ORDER — EPHEDRINE SULFATE-NACL 50-0.9 MG/10ML-% IV SOSY
PREFILLED_SYRINGE | INTRAVENOUS | Status: DC | PRN
  Administered 2021-08-20: 10 mg via INTRAVENOUS

## 2021-08-20 MED ORDER — LORATADINE 10 MG PO TABS
10.0000 mg | ORAL_TABLET | Freq: Every evening | ORAL | Status: DC
  Administered 2021-08-21: 10 mg via ORAL
  Filled 2021-08-20: qty 1

## 2021-08-20 MED ORDER — IBUPROFEN 600 MG PO TABS
600.0000 mg | ORAL_TABLET | Freq: Four times a day (QID) | ORAL | Status: DC
  Administered 2021-08-21 – 2021-08-22 (×5): 600 mg via ORAL
  Filled 2021-08-20 (×4): qty 1

## 2021-08-20 MED ORDER — DIPHENHYDRAMINE HCL 50 MG/ML IJ SOLN: 12.5000 mg | INTRAMUSCULAR | Status: DC | PRN

## 2021-08-20 MED ORDER — KETOROLAC TROMETHAMINE 30 MG/ML IJ SOLN: 30.0000 mg | Freq: Four times a day (QID) | INTRAMUSCULAR | Status: DC | PRN

## 2021-08-20 MED ORDER — LACTATED RINGERS IV SOLN: INTRAVENOUS | Status: DC

## 2021-08-20 MED ORDER — BUPIVACAINE IN DEXTROSE 0.75-8.25 % IT SOLN
INTRATHECAL | Status: DC | PRN
  Administered 2021-08-20: 1.8 mL via INTRATHECAL

## 2021-08-20 MED ORDER — NALOXONE HCL 4 MG/10ML IJ SOLN
1.0000 ug/kg/h | INTRAVENOUS | Status: DC | PRN
  Filled 2021-08-20: qty 5

## 2021-08-20 MED ORDER — CLINDAMYCIN PHOSPHATE 900 MG/50ML IV SOLN
900.0000 mg | INTRAVENOUS | Status: AC
  Administered 2021-08-20: 900 mg via INTRAVENOUS

## 2021-08-20 MED ORDER — OXYCODONE HCL 5 MG PO TABS
5.0000 mg | ORAL_TABLET | Freq: Four times a day (QID) | ORAL | Status: DC | PRN
  Administered 2021-08-21: 5 mg via ORAL
  Filled 2021-08-20: qty 1

## 2021-08-20 MED ORDER — DIPHENHYDRAMINE HCL 25 MG PO CAPS: 25.0000 mg | ORAL_CAPSULE | ORAL | Status: DC | PRN

## 2021-08-20 MED ORDER — WITCH HAZEL-GLYCERIN EX PADS: 1.0000 "application " | MEDICATED_PAD | CUTANEOUS | Status: DC | PRN

## 2021-08-20 MED ORDER — POVIDONE-IODINE 10 % EX SWAB
2.0000 "application " | Freq: Once | CUTANEOUS | Status: AC
  Administered 2021-08-20: 2 via TOPICAL

## 2021-08-20 MED ORDER — KETOROLAC TROMETHAMINE 30 MG/ML IJ SOLN
30.0000 mg | Freq: Four times a day (QID) | INTRAMUSCULAR | Status: DC | PRN
  Administered 2021-08-20: 30 mg via INTRAVENOUS

## 2021-08-20 MED ORDER — SCOPOLAMINE 1 MG/3DAYS TD PT72
MEDICATED_PATCH | TRANSDERMAL | Status: DC | PRN
  Administered 2021-08-20: 1 via TRANSDERMAL

## 2021-08-20 MED ORDER — FENTANYL CITRATE (PF) 100 MCG/2ML IJ SOLN: 25.0000 ug | INTRAMUSCULAR | Status: DC | PRN

## 2021-08-20 MED ORDER — COCONUT OIL OIL: 1.0000 "application " | TOPICAL_OIL | Status: DC | PRN

## 2021-08-20 MED ORDER — SIMETHICONE 80 MG PO CHEW
80.0000 mg | CHEWABLE_TABLET | Freq: Three times a day (TID) | ORAL | Status: DC
  Administered 2021-08-21 – 2021-08-22 (×3): 80 mg via ORAL
  Filled 2021-08-20 (×5): qty 1

## 2021-08-20 MED ORDER — CLINDAMYCIN PHOSPHATE 900 MG/50ML IV SOLN
INTRAVENOUS | Status: AC
  Filled 2021-08-20: qty 50

## 2021-08-20 MED ORDER — SCOPOLAMINE 1 MG/3DAYS TD PT72
MEDICATED_PATCH | TRANSDERMAL | Status: AC
  Filled 2021-08-20: qty 1

## 2021-08-20 MED ORDER — PHENYLEPHRINE HCL-NACL 20-0.9 MG/250ML-% IV SOLN
INTRAVENOUS | Status: DC | PRN
  Administered 2021-08-20: 60 ug/min via INTRAVENOUS

## 2021-08-20 MED ORDER — PHENYLEPHRINE HCL-NACL 20-0.9 MG/250ML-% IV SOLN
INTRAVENOUS | Status: AC
  Filled 2021-08-20: qty 250

## 2021-08-20 MED ORDER — SENNOSIDES-DOCUSATE SODIUM 8.6-50 MG PO TABS
2.0000 | ORAL_TABLET | Freq: Every day | ORAL | Status: DC
  Administered 2021-08-21 – 2021-08-22 (×2): 2 via ORAL
  Filled 2021-08-20 (×2): qty 2

## 2021-08-20 MED ORDER — DIBUCAINE (PERIANAL) 1 % EX OINT: 1.0000 "application " | TOPICAL_OINTMENT | CUTANEOUS | Status: DC | PRN

## 2021-08-20 MED ORDER — DIPHENHYDRAMINE HCL 25 MG PO CAPS
25.0000 mg | ORAL_CAPSULE | Freq: Four times a day (QID) | ORAL | Status: DC | PRN
Start: 2021-08-20 — End: 2021-08-23

## 2021-08-20 MED ORDER — ONDANSETRON HCL 4 MG/2ML IJ SOLN
INTRAMUSCULAR | Status: DC | PRN
  Administered 2021-08-20: 4 mg via INTRAVENOUS

## 2021-08-20 MED ORDER — GENTAMICIN SULFATE 40 MG/ML IJ SOLN
5.0000 mg/kg | INTRAVENOUS | Status: AC
  Administered 2021-08-20: 360 mg via INTRAVENOUS
  Filled 2021-08-20: qty 9

## 2021-08-20 MED ORDER — OXYTOCIN-SODIUM CHLORIDE 30-0.9 UT/500ML-% IV SOLN: 2.5000 [IU]/h | INTRAVENOUS | Status: AC

## 2021-08-20 MED ORDER — MORPHINE SULFATE (PF) 0.5 MG/ML IJ SOLN
INTRAMUSCULAR | Status: DC | PRN
  Administered 2021-08-20: .15 mg via INTRATHECAL

## 2021-08-20 MED ORDER — MEPERIDINE HCL 25 MG/ML IJ SOLN: 6.2500 mg | INTRAMUSCULAR | Status: DC | PRN

## 2021-08-20 MED ORDER — MENTHOL 3 MG MT LOZG: 1.0000 | LOZENGE | OROMUCOSAL | Status: DC | PRN

## 2021-08-20 MED ORDER — MORPHINE SULFATE (PF) 0.5 MG/ML IJ SOLN
INTRAMUSCULAR | Status: AC
  Filled 2021-08-20: qty 10

## 2021-08-20 MED ORDER — OXYTOCIN-SODIUM CHLORIDE 30-0.9 UT/500ML-% IV SOLN
INTRAVENOUS | Status: DC | PRN
  Administered 2021-08-20: 300 mL via INTRAVENOUS

## 2021-08-20 MED ORDER — NALOXONE HCL 0.4 MG/ML IJ SOLN: 0.4000 mg | INTRAMUSCULAR | Status: DC | PRN

## 2021-08-20 MED ORDER — TETANUS-DIPHTH-ACELL PERTUSSIS 5-2.5-18.5 LF-MCG/0.5 IM SUSY: 0.5000 mL | PREFILLED_SYRINGE | Freq: Once | INTRAMUSCULAR | Status: DC

## 2021-08-20 MED ORDER — ONDANSETRON HCL 4 MG/2ML IJ SOLN
INTRAMUSCULAR | Status: AC
  Filled 2021-08-20: qty 2

## 2021-08-20 MED ORDER — FENTANYL CITRATE (PF) 100 MCG/2ML IJ SOLN
INTRAMUSCULAR | Status: DC | PRN
  Administered 2021-08-20: 15 ug via INTRATHECAL

## 2021-08-20 MED ORDER — PRENATAL MULTIVITAMIN CH
1.0000 | ORAL_TABLET | Freq: Every day | ORAL | Status: DC
  Administered 2021-08-21 – 2021-08-22 (×2): 1 via ORAL
  Filled 2021-08-20 (×2): qty 1

## 2021-08-20 MED ORDER — ONDANSETRON HCL 4 MG/2ML IJ SOLN: 4.0000 mg | Freq: Three times a day (TID) | INTRAMUSCULAR | Status: DC | PRN

## 2021-08-20 MED ORDER — EPHEDRINE 5 MG/ML INJ
INTRAVENOUS | Status: AC
  Filled 2021-08-20: qty 5

## 2021-08-20 MED ORDER — ACETAMINOPHEN 325 MG PO TABS
650.0000 mg | ORAL_TABLET | ORAL | Status: DC | PRN
  Administered 2021-08-21 – 2021-08-22 (×5): 650 mg via ORAL
  Filled 2021-08-20 (×5): qty 2

## 2021-08-20 MED ORDER — KETOROLAC TROMETHAMINE 30 MG/ML IJ SOLN
30.0000 mg | Freq: Four times a day (QID) | INTRAMUSCULAR | Status: AC
  Administered 2021-08-20 – 2021-08-21 (×3): 30 mg via INTRAVENOUS
  Filled 2021-08-20 (×3): qty 1

## 2021-08-20 MED ORDER — FENTANYL CITRATE (PF) 100 MCG/2ML IJ SOLN
INTRAMUSCULAR | Status: AC
  Filled 2021-08-20: qty 2

## 2021-08-20 MED ORDER — SODIUM CHLORIDE 0.9 % IR SOLN
Status: DC | PRN
  Administered 2021-08-20: 1000 mL

## 2021-08-20 MED ORDER — SIMETHICONE 80 MG PO CHEW: 80.0000 mg | CHEWABLE_TABLET | ORAL | Status: DC | PRN

## 2021-08-20 MED ORDER — KETOROLAC TROMETHAMINE 30 MG/ML IJ SOLN
INTRAMUSCULAR | Status: AC
  Filled 2021-08-20: qty 1

## 2021-08-20 MED ORDER — SODIUM CHLORIDE 0.9% FLUSH: 3.0000 mL | INTRAVENOUS | Status: DC | PRN

## 2021-08-20 SURGICAL SUPPLY — 36 items
BENZOIN TINCTURE PRP APPL 2/3 (GAUZE/BANDAGES/DRESSINGS) ×1 IMPLANT
CHLORAPREP W/TINT 26ML (MISCELLANEOUS) ×2 IMPLANT
CLAMP CORD UMBIL (MISCELLANEOUS) IMPLANT
CLOSURE STERI STRIP 1/2 X4 (GAUZE/BANDAGES/DRESSINGS) ×1 IMPLANT
CLOTH BEACON ORANGE TIMEOUT ST (SAFETY) ×2 IMPLANT
DRSG OPSITE POSTOP 4X10 (GAUZE/BANDAGES/DRESSINGS) ×2 IMPLANT
ELECT REM PT RETURN 9FT ADLT (ELECTROSURGICAL) ×2
ELECTRODE REM PT RTRN 9FT ADLT (ELECTROSURGICAL) ×1 IMPLANT
EXTRACTOR VACUUM KIWI (MISCELLANEOUS) IMPLANT
EXTRACTOR VACUUM M CUP 4 TUBE (SUCTIONS) IMPLANT
GLOVE SURG ENC MOIS LTX SZ7 (GLOVE) ×2 IMPLANT
GLOVE SURG UNDER POLY LF SZ7 (GLOVE) ×2 IMPLANT
GOWN STRL REUS W/TWL LRG LVL3 (GOWN DISPOSABLE) ×4 IMPLANT
KIT ABG SYR 3ML LUER SLIP (SYRINGE) IMPLANT
NDL HYPO 25X5/8 SAFETYGLIDE (NEEDLE) IMPLANT
NEEDLE HYPO 25X5/8 SAFETYGLIDE (NEEDLE) IMPLANT
NS IRRIG 1000ML POUR BTL (IV SOLUTION) ×2 IMPLANT
PACK C SECTION WH (CUSTOM PROCEDURE TRAY) ×2 IMPLANT
PAD OB MATERNITY 4.3X12.25 (PERSONAL CARE ITEMS) ×2 IMPLANT
RTRCTR C-SECT PINK 25CM LRG (MISCELLANEOUS) IMPLANT
STRIP CLOSURE SKIN 1/2X4 (GAUZE/BANDAGES/DRESSINGS) IMPLANT
SUT MNCRL 0 VIOLET CTX 36 (SUTURE) ×2 IMPLANT
SUT MNCRL AB 3-0 PS2 27 (SUTURE) ×1 IMPLANT
SUT MONOCRYL 0 CTX 36 (SUTURE) ×2
SUT PLAIN 0 NONE (SUTURE) IMPLANT
SUT PLAIN 2 0 (SUTURE)
SUT PLAIN ABS 2-0 CT1 27XMFL (SUTURE) IMPLANT
SUT VIC AB 0 CT1 27 (SUTURE) ×2
SUT VIC AB 0 CT1 27XBRD ANBCTR (SUTURE) ×2 IMPLANT
SUT VIC AB 2-0 CT1 27 (SUTURE) ×1
SUT VIC AB 2-0 CT1 TAPERPNT 27 (SUTURE) ×1 IMPLANT
SUT VIC AB 4-0 KS 27 (SUTURE) ×2 IMPLANT
SUT VICRYL 0 TIES 12 18 (SUTURE) IMPLANT
TOWEL OR 17X24 6PK STRL BLUE (TOWEL DISPOSABLE) ×2 IMPLANT
TRAY FOLEY W/BAG SLVR 14FR LF (SET/KITS/TRAYS/PACK) IMPLANT
WATER STERILE IRR 1000ML POUR (IV SOLUTION) ×2 IMPLANT

## 2021-08-20 NOTE — Lactation Note (Signed)
This note was copied from a baby's chart. Lactation Consultation Note  Patient Name: Denise Mcdaniel LOVFI'E Date: 08/20/2021 Reason for consult: Mother's request;Term (C/S delivery) Age:35 hours. P2, C/S delivery. Mom wanted LC assistance with assessing infant's latch. Mom is experienced at breastfeeding, she BF her 1st child for 20 months who is currently 33 years old. Mom had infant latched on her left breast using the cradle hold,  infant was swaddled in a blanket, infant was suckling a little at this time, per dad, infant had been breastfeeding for 40 minutes. Mom broke latch and re-latched infant on her right breast using the football hold position, infant latched with depth, suckle few times then went to sleep. Mom feels infant is breastfeeding well, she feels a tug with most latches and no pain.  LC suggested mom latch infant on both breast during a feeding instead of one breast. Mom will continue to breastfeed infant according to hunger cues, 8 to 12+ or more times within 24 hours, skin to skin.  Mom will do breast stimulation techniques to keep infant awake while BF such as: gently stroking infant's neck and shoulder, breast compressions and talking to infant. Mom knows to call RN/LC if she has any further breastfeeding questions, concerns or would like further assistance with latching infant at the breast.  Maternal Data Does the patient have breastfeeding experience prior to this delivery?: Yes How long did the patient breastfeed?: > 1 year  Feeding Mother's Current Feeding Choice: Breast Milk  LATCH Score Latch: Grasps breast easily, tongue down, lips flanged, rhythmical sucking.  Audible Swallowing: A few with stimulation  Type of Nipple: Everted at rest and after stimulation  Comfort (Breast/Nipple): Soft / non-tender  Hold (Positioning): No assistance needed to correctly position infant at breast.  LATCH Score: 9   Lactation Tools Discussed/Used     Interventions    Discharge Pump: Personal;DEBP;Manual (per mom  has both at home)  Consult Status Consult Status: Follow-up Date: 08/21/21 Follow-up type: In-patient    Danelle Earthly 08/20/2021, 5:57 PM

## 2021-08-20 NOTE — Anesthesia Postprocedure Evaluation (Signed)
Anesthesia Post Note  Patient: Denise Mcdaniel  Procedure(s) Performed: Repeat CESAREAN SECTION     Patient location during evaluation: PACU Anesthesia Type: Spinal Level of consciousness: oriented and awake and alert Pain management: pain level controlled Vital Signs Assessment: post-procedure vital signs reviewed and stable Respiratory status: spontaneous breathing, respiratory function stable and nonlabored ventilation Cardiovascular status: blood pressure returned to baseline and stable Postop Assessment: no headache, no backache, no apparent nausea or vomiting, spinal receding and patient able to bend at knees Anesthetic complications: no   No notable events documented.  Last Vitals:  Vitals:   08/20/21 1430 08/20/21 1511  BP: 100/77 (!) 115/56  Pulse: 81 68  Resp: 19 20  Temp: 36.4 C 36.6 C  SpO2: 99% 99%    Last Pain:  Vitals:   08/20/21 1511  TempSrc: Axillary  PainSc: 0-No pain   Pain Goal:                Epidural/Spinal Function Cutaneous sensation: Able to Wiggle Toes (08/20/21 1511), Patient able to flex knees: Yes (08/20/21 1511), Patient able to lift hips off bed: Yes (08/20/21 1511), Back pain beyond tenderness at insertion site: No (08/20/21 1511), Progressively worsening motor and/or sensory loss: No (08/20/21 1511), Bowel and/or bladder incontinence post epidural: No (08/20/21 1511)  Serria Sloma A.

## 2021-08-20 NOTE — Transfer of Care (Signed)
Immediate Anesthesia Transfer of Care Note  Patient: Denise Mcdaniel  Procedure(s) Performed: Repeat CESAREAN SECTION  Patient Location: PACU  Anesthesia Type:Spinal  Level of Consciousness: awake  Airway & Oxygen Therapy: Patient Spontanous Breathing  Post-op Assessment: Report given to RN and Post -op Vital signs reviewed and stable  Post vital signs: Reviewed and stable  Last Vitals:  Vitals Value Taken Time  BP    Temp    Pulse    Resp    SpO2      Last Pain:  Vitals:   08/20/21 0950  TempSrc: Oral         Complications: No notable events documented.

## 2021-08-20 NOTE — Anesthesia Procedure Notes (Signed)
Spinal  Patient location during procedure: OR Start time: 08/20/2021 12:40 PM End time: 08/20/2021 12:44 PM Reason for block: surgical anesthesia Staffing Performed: anesthesiologist  Anesthesiologist: Mal Amabile, MD Preanesthetic Checklist Completed: patient identified, IV checked, site marked, risks and benefits discussed, surgical consent, monitors and equipment checked, pre-op evaluation and timeout performed Spinal Block Patient position: sitting Prep: DuraPrep Patient monitoring: heart rate, cardiac monitor, continuous pulse ox and blood pressure Approach: midline Location: L3-4 Injection technique: single-shot Needle Needle type: Pencan  Needle gauge: 24 G Needle length: 9 cm Needle insertion depth: 7 cm Assessment Sensory level: T4 Events: CSF return Additional Notes Patient tolerated procedure well. Adequate sensory level.

## 2021-08-20 NOTE — Lactation Note (Signed)
This note was copied from a baby's chart. Lactation Consultation Note  Patient Name: Denise Mcdaniel Today's Date: 08/20/2021 Reason for consult: Initial assessment;Term;Other (Comment) (per mom baby has fed x 2 since birth 30 mins each. Baby STS on dads chest due to mom feeling nauseated. MOm aware to call with feeding cues for feeding assessment.) Age:35 hours LC reviewed and updated the doc flow sheets.   Maternal Data Does the patient have breastfeeding experience prior to this delivery?: Yes How long did the patient breastfeed?: > 1 year  Feeding Mother's Current Feeding Choice: Breast Milk  LATCH Score                    Lactation Tools Discussed/Used    Interventions    Discharge Pump: Personal;DEBP;Manual (per mom  has both at home)  Consult Status Consult Status: Follow-up Date: 08/20/21 Follow-up type: In-patient    Denise Mcdaniel [name] 08/20/2021, 4:54 PM

## 2021-08-20 NOTE — Op Note (Signed)
Denise Section Procedure Note   [name]  08/20/2021  Procedure: Repeat elective low transverse Denise section   Indications: Scheduled Proceedure/Maternal Request 39.2 wks   Pre-operative Diagnosis: Previous Denise Section.   Post-operative Diagnosis: Same   Surgeon: Azucena Fallen, MD    Assistants: Derrell Lolling CNM   Anesthesia: spinal   Procedure Details:  The patient was seen in the Holding Room. The risks, benefits, complications, treatment options, and expected outcomes were discussed with the patient. The patient concurred with the proposed plan, giving informed consent. identified as Denise Mcdaniel and the procedure verified as C-Section Delivery. A Time Out was held and the above information confirmed. Gentamicin and Clindamycin given due to PCN allergy. After induction of anesthesia, the patient was draped and prepped in the usual sterile manner, foley was draining urine well.  A pfannenstiel incision was made and carried down through the subcutaneous tissue to the fascia. Fascial incision was made and extended transversely. The fascia was separated from the underlying rectus tissue superiorly and inferiorly. The peritoneum was identified and entered. Peritoneal incision was extended longitudinally. Alexis-O retractor placed. The utero-vesical peritoneal reflection was incised transversely and the bladder flap was bluntly freed from the lower uterine segment. A low transverse uterine incision was made. Clear amniotic fluid noted.  Delivered from cephalic presentation was a female infant with vigorous cry. Apgar scores of 9 at one minute and 9 at five minutes. Delayed cord clamping done at 1 minute and baby handed to NICU team in attendance. Cord ph was not sent. Cord blood was obtained for evaluation. The placenta was removed Intact and appeared normal. The uterine outline, tubes and ovaries appeared normal}. The uterine incision was closed with running locked sutures of  0Monocryl. Single layer closure. Hemostasis was observed. Alexis retractor removed. Peritoneal closure done with 2-0 Vicryl.  The fascia was then reapproximated with running sutures of 0Vicryl. The subcuticular closure was performed using 2-0plain gut. The skin was closed with 3-0 Monocryl. Steristrips/ honeycomb placed  Instrument, sponge, and needle counts were correct prior the abdominal closure and were correct at the conclusion of the case.    Findings:female infant delivered cephalic w/ Apgars 9 and 9. Kerr hysterotomy, single layer closure    Estimated Blood Loss: 300 mL   Total IV Fluids: 2000 ml LR   Urine Output:  100CC OF clear urine  Specimens: cord blood   Complications: no complications  Disposition: PACU - hemodynamically stable.   Maternal Condition: stable   Baby condition / location:  Couplet care / Skin to Skin  Attending Attestation: I performed the procedure.   Signed: Surgeon(s): Azucena Fallen, MD

## 2021-08-21 ENCOUNTER — Encounter (HOSPITAL_COMMUNITY): Payer: Self-pay | Admitting: Obstetrics & Gynecology

## 2021-08-21 LAB — BIRTH TISSUE RECOVERY COLLECTION (PLACENTA DONATION)

## 2021-08-21 LAB — CBC
HCT: 29.8 % — ABNORMAL LOW (ref 36.0–46.0)
Hemoglobin: 10.7 g/dL — ABNORMAL LOW (ref 12.0–15.0)
MCH: 29 pg (ref 26.0–34.0)
MCHC: 35.9 g/dL (ref 30.0–36.0)
MCV: 80.8 fL (ref 80.0–100.0)
Platelets: 246 10*3/uL (ref 150–400)
RBC: 3.69 MIL/uL — ABNORMAL LOW (ref 3.87–5.11)
RDW: 13.4 % (ref 11.5–15.5)
WBC: 11.1 10*3/uL — ABNORMAL HIGH (ref 4.0–10.5)
nRBC: 0 % (ref 0.0–0.2)

## 2021-08-21 NOTE — Progress Notes (Signed)
° °  Subjective: POD# 1 Live born female  Birth Weight: 8 lb (3630 g) APGAR: 8, 9  Newborn Delivery   Birth date/time: 08/20/2021 13:05:00 Delivery type: C-Section, Low Transverse Trial of labor: No C-section categorization: Repeat     Baby name: Malia Delivering provider: MODY, VAISHALI   Feeding: breast  Pain control at delivery: Spinal   Reports feeling well,  Patient reports tolerating PO.   Breast symptoms: + colostrum Pain controlled with  PO meds Denies HA/SOB/C/P/N/V/dizziness. Flatus present. She reports vaginal bleeding as normal, without clots.  She is ambulating, urinating without difficulty.     Objective:   VS:    Vitals:   08/20/21 1610 08/20/21 2004 08/21/21 0100 08/21/21 0403  BP: 114/67 105/68 100/62 (!) 94/50  Pulse: 79 79 89 70  Resp: 20 18 16 18   Temp: 98.9 F (37.2 C) 99.2 F (37.3 C) 99.1 F (37.3 C) 98.7 F (37.1 C)  TempSrc: Axillary Axillary Oral Oral  SpO2: 100% 99%    Weight:      Height:         Intake/Output Summary (Last 24 hours) at 08/21/2021 0843 Last data filed at 08/21/2021 0433 Gross per 24 hour  Intake 2159 ml  Output 1575 ml  Net 584 ml        Recent Labs    08/18/21 1100 08/21/21 0504  WBC 11.4* 11.1*  HGB 12.3 10.7*  HCT 35.0* 29.8*  PLT 332 246     Blood type: --/--/B POS (01/16 1030)  Rubella: Immune (07/01 0000)  Vaccines: TDaP          UTD         Flu             UTD                    COVID-19 UTD   Physical Exam:  General: alert, cooperative, and no distress CV: Regular rate and rhythm Resp: clear Abdomen: soft, nontender, normal bowel sounds Incision: intact and serous and small drainage present Uterine Fundus: firm, below umbilicus, nontender Lochia: minimal Ext: no edema, redness or tenderness in the calves or thighs  Assessment/Plan: 36 y.o.   POD# 1. 20                  Principal Problem:   Postpartum care following cesarean delivery 1/18 Active Problems:   Cesarean delivery -  repeat   Previous cesarean section   Status post repeat low transverse cesarean section   Doing well, stable.               Advance diet as tolerated Encourage rest when baby rests Breastfeeding support Encourage to ambulate Routine post-op care Anticipate DC in AM   2/18, CNM, MSN 08/21/2021, 8:43 AM

## 2021-08-21 NOTE — Progress Notes (Signed)
CSW received consult for hx of Anxiety.  CSW met with MOB to offer support and complete assessment, FOB present. MOB granted CSW verbal permission to speak in front of FOB about anything. CSW introduced self and explained role. MOB was welcoming, open, pleasant, and remained engaged during assessment. CSW and MOB discussed MOB's mental health history. MOB reported that she was diagnosed with Anxiety during middle school. FOB shared that MOB had some anxiety leading up to the birth. MOB attributed her anxiety to being away from their 4 year old son. CSW acknowledged, validated, and normalized MOB's feelings. MOB reported that she experienced postpartum depression after she stopped breastfeeding her son around 20 weeks. MOB described her postpartum depression as intrusive thoughts, anxiety, and depression. MOB reported that it lasted about 6 months and she participated in therapy which was helpful in her identifying that she was experiencing postpartum depression. MOB share that she feels that this time around she can notice the signs of PPD if they appear. FOB asked about therapy resources if needed, CSW provided therapy resources. CSW inquired about how MOB was feeling emotionally since giving birth, MOB reported that she was feeling calm. MOB presented calm and did not demonstrate any acute mental health signs/symptoms. CSW assessed for safety, MOB denied SI and HI. CSW did not assess for domestic violence as FOB was present. CSW inquired about MOB's support system, MOB reported that her husband and neighbors are supports. MOB shared that their families don't live locally. FOB shared that they have family in [place] that can come to provide support if needed.  ° °CSW provided education regarding the baby blues period vs. perinatal mood disorders, discussed treatment and gave resources for mental health follow up if concerns arise.  CSW recommends self-evaluation during the postpartum time period using the New Mom  Checklist from Postpartum Progress and encouraged MOB to contact a medical professional if symptoms are noted at any time.   ° °CSW provided review of Sudden Infant Death Syndrome (SIDS) precautions. Parents verbalized understanding and reported having all needed items to care for infant including a car seat and basinet.  °  °CSW identifies no further need for intervention and no barriers to discharge at this time. ° °Jamion Carter, LCSW °Clinical Social Worker °[facility] °Cell#: [phone] ° °

## 2021-08-21 NOTE — Lactation Note (Signed)
This note was copied from a baby's chart. Lactation Consultation Note  Patient Name: Denise Mcdaniel [id number]'S Date: 08/21/2021 Reason for consult: Follow-up assessment Age:35 hours  Mother denies questions or concerns and states breastfeeding has improved. Reviewed cluster feeding and suggest calling for help as needed.   Feeding Mother's Current Feeding Choice: Breast Milk  Interventions Interventions: Education  Consult Status Consult Status: Follow-up Date: 08/22/21 Follow-up type: In-patient    [name] [facility] 08/21/2021, 12:18 PM

## 2021-08-22 MED ORDER — OXYCODONE HCL 5 MG PO TABS: 5.0000 mg | ORAL_TABLET | Freq: Four times a day (QID) | ORAL | 0 refills | Status: AC | PRN

## 2021-08-22 MED ORDER — IBUPROFEN 600 MG PO TABS: 600.0000 mg | ORAL_TABLET | Freq: Four times a day (QID) | ORAL | 0 refills | Status: AC

## 2021-08-22 NOTE — Lactation Note (Signed)
This note was copied from a baby's chart. Lactation Consultation Note  Patient Name: Girl Zakya Kuzmin [id number] Date: 08/22/2021 Reason for consult: Follow-up assessment;Term Age:35 hours  Mom denies cracking, bleeding, compression stripes or nipple shape distortion when "Malia" releases latch. Mom says that nursing is going well, except for some mild nipple soreness. Infant has had excellent output (9 voids & 8 stools documented thus far).   Specifics of an asymmetric latch were shown via [employer]. Mom declined LC observing a latch. Parents know how to reach [place] for any post-discharge questions.   Matthias Hughs [facility] 08/22/2021, 8:01 AM

## 2021-08-22 NOTE — Lactation Note (Signed)
This note was copied from a baby's chart. Lactation Consultation Note  Patient Name: Denise Mcdaniel [id number] Date: 08/22/2021 Reason for consult: MD order;Follow-up assessment;Other (Comment) Age:35 hours  Mom was able to express 7.5 mL with the hand pump (using the size 21 flange). The EBM & an additional 8 mL of DBM was given at breast (with 5 Fr & syringe) successfully.   Matthias Hughs [facility] 08/22/2021, 1:25 PM

## 2021-08-22 NOTE — Lactation Note (Addendum)
This note was copied from a baby's chart. Lactation Consultation Note  Patient Name: Denise Mcdaniel IRWER'X Date: 08/22/2021 Reason for consult: MD order;Follow-up assessment;Other (Comment) Age:35 hours  Plan of care reviewed with parents. Hand pump and 5 Fr & syringe were washed. RN to assist with supplementing at breast. Will call LC prn.   Lurline Hare [facility] 08/22/2021, 1:52 PM

## 2021-08-22 NOTE — Progress Notes (Signed)
No c/o; tol po; ambulating, voiding w/o difficulty Pain controlled, +flatus Breastfeeding; lochia wnl  Patient Vitals for the past 24 hrs:  BP Temp Temp src Pulse Resp SpO2  08/22/21 0506 105/63 98.2 F (36.8 C) Oral 80 18 --  08/21/21 1938 111/70 98.4 F (36.9 C) Oral 89 16 100 %  08/21/21 1400 (!) 106/58 98.9 F (37.2 C) Oral 82 18 99 %   A&ox3 Rrr Ctab Abd: +bs; soft,nt,nd; dressing c/d/I; fundus firm and below umb LE: no edema, nt bilat  CBC Latest Ref Rng & Units 08/21/2021 08/18/2021 02/11/2020  WBC 4.0 - 10.5 K/uL 11.1(H) 11.4(H) 7.4  Hemoglobin 12.0 - 15.0 g/dL 10.7(L) 12.3 12.8  Hematocrit 36.0 - 46.0 % 29.8(L) 35.0(L) 35.9(L)  Platelets 150 - 400 K/uL 246 332 305   A/P: pod2 s/p rltcs Doing well-d/c home today; f/u in 6 wks in office [place] RH pos Breastfeeding Mild anemia d/t acute blood loss, asymptomatic, iron q day

## 2021-08-22 NOTE — Discharge Summary (Signed)
Postpartum Discharge Summary  Date of Service updated     Patient Name: Denise Mcdaniel DOB: 12-May-1986 MRN: [id number]  Date of admission: 08/20/2021 Delivery date:08/20/2021  Delivering provider: MODY, VAISHALI  Date of discharge: 08/22/2021  Admitting diagnosis: Previous cesarean section [Z98.891] Status post repeat low transverse cesarean section [Z98.891] Intrauterine pregnancy: [redacted]w[redacted]d     Secondary diagnosis:  Principal Problem:   Postpartum care following cesarean delivery 1/18 Active Problems:   Cesarean delivery - repeat   Previous cesarean section   Status post repeat low transverse cesarean section  Additional problems: none    Discharge diagnosis: Term Pregnancy Delivered                                              Post partum procedures: none Augmentation: N/A Complications: None  Hospital course: Sceduled C/S   36 y.o. yo G5P1031 at [redacted]w[redacted]d was admitted to the hospital 08/20/2021 for scheduled cesarean section with the following indication:Elective Repeat.Delivery details are as follows:  Membrane Rupture Time/Date: 1:05 PM ,08/20/2021   Delivery Method:C-Section, Low Transverse  Details of operation can be found in separate operative note.  Patient had an uncomplicated postpartum course.  She is ambulating, tolerating a regular diet, passing flatus, and urinating well. Patient is discharged home in stable condition on  08/22/21        Newborn Data: Birth date:08/20/2021  Birth time:1:05 PM  Gender:Female  Living status:Living  Apgars:8 ,9  Weight:3630 g      Physical exam  Vitals:   08/21/21 0403 08/21/21 1400 08/21/21 1938 08/22/21 0506  BP: (!) 94/50 (!) 106/58 111/70 105/63  Pulse: 70 82 89 80  Resp: 18 18 16 18   Temp: 98.7 F (37.1 C) 98.9 F (37.2 C) 98.4 F (36.9 C) 98.2 F (36.8 C)  TempSrc: Oral Oral Oral Oral  SpO2:  99% 100%   Weight:      Height:       General: alert, cooperative, and no distress Lochia: appropriate Uterine Fundus:  firm Incision: Healing well with no significant drainage DVT Evaluation: No evidence of DVT seen on physical exam. Labs: Lab Results  Component Value Date   WBC 11.1 (H) 08/21/2021   HGB 10.7 (L) 08/21/2021   HCT 29.8 (L) 08/21/2021   MCV 80.8 08/21/2021   PLT 246 08/21/2021   CMP Latest Ref Rng & Units 02/11/2020  Glucose 70 - 99 mg/dL 04/13/2020)  BUN 6 - 20 mg/dL 10  Creatinine 026(V - 7.85 mg/dL 8.85  Sodium 0.27 - 741 mmol/L 142  Potassium 3.5 - 5.1 mmol/L 3.8  Chloride 98 - 111 mmol/L 104   Edinburgh Score: Edinburgh Postnatal Depression Scale Screening Tool 08/20/2021  I have been able to laugh and see the funny side of things. 0  I have looked forward with enjoyment to things. 0  I have blamed myself unnecessarily when things went wrong. 2  I have been anxious or worried for no good reason. 1  I have felt scared or panicky for no good reason. 1  Things have been getting on top of me. 1  I have been so unhappy that I have had difficulty sleeping. 0  I have felt sad or miserable. 1  I have been so unhappy that I have been crying. 0  The thought of harming myself has occurred to me. 0  08/22/2021  Postnatal Depression Scale Total 6      After visit meds:  Allergies as of 08/22/2021       Reactions   Elemental Sulfur Anaphylaxis   Phenergan [promethazine Hcl]    Dystonia loss of facial muscles   Shellfish Allergy Diarrhea   Penicillins Rash        Medication List     TAKE these medications    ibuprofen 600 MG tablet Commonly known as: ADVIL Take 1 tablet (600 mg total) by mouth every 6 (six) hours.   loratadine 10 MG tablet Commonly known as: CLARITIN Take 10 mg by mouth every evening.   oxyCODONE 5 MG immediate release tablet Commonly known as: Oxy IR/ROXICODONE Take 1 tablet (5 mg total) by mouth every 6 (six) hours as needed for moderate pain.   PRENATAL PO Take 1 tablet by mouth daily.         Discharge home in stable condition Infant Feeding:  Breast Infant Disposition:home with mother Discharge instruction: per After Visit Summary and Postpartum booklet. Activity: Advance as tolerated. Pelvic rest for 6 weeks.  Diet: routine diet Anticipated Birth Control: Unsure Postpartum Appointment:6 weeks Additional Postpartum F/U:  na Future Appointments:No future appointments. Follow up Visit:      08/22/2021 Vick Frees, MD

## 2021-08-22 NOTE — Lactation Note (Signed)
This note was copied from a baby's chart. Lactation Consultation Note  Patient Name: Denise Mcdaniel JMEQA'S Date: 08/22/2021 Reason for consult: MD order;Follow-up assessment;Other (Comment) Age:35 hours  MD requested that I see patient b/c of infant's low blood sugar.   I observed Malia at breast. Although there were some swallows, I noted that she fell asleep often during the feeding. I discussed supplementing with EBM/PDM. Mom will use hand pump and then call me to return to see if we can supplement at the breast.  Lurline Hare [facility] 08/22/2021, 12:12 PM

## 2021-09-02 ENCOUNTER — Telehealth (HOSPITAL_COMMUNITY): Payer: Self-pay | Admitting: *Deleted

## 2021-09-02 NOTE — Telephone Encounter (Signed)
Phone voicemail message left to return nurse call.  Odis Hollingshead, RN 09-02-21 at 3:19pm

## 2022-01-01 IMAGING — US US OB < 14 WEEKS - US OB TV
1 series · 15 of 28 positions shown · non-contrast
Comparison: None.

CLINICAL DATA: 33-year-old female with positive pregnancy test and
vaginal bleeding. Estimated gestational age of 4 weeks 4 days by
LMP.

EXAM:
OBSTETRIC <14 WK US AND TRANSVAGINAL OB US
TECHNIQUE: Both transabdominal and transvaginal ultrasound examinations were
performed for complete evaluation of the gestation as well as the
maternal uterus, adnexal regions, and pelvic cul-de-sac.
Transvaginal technique was performed to assess early pregnancy.

[Series 1: us ob < 14 weeks - us ob tv · 15 of 51 slices shown]
[im 1/51]
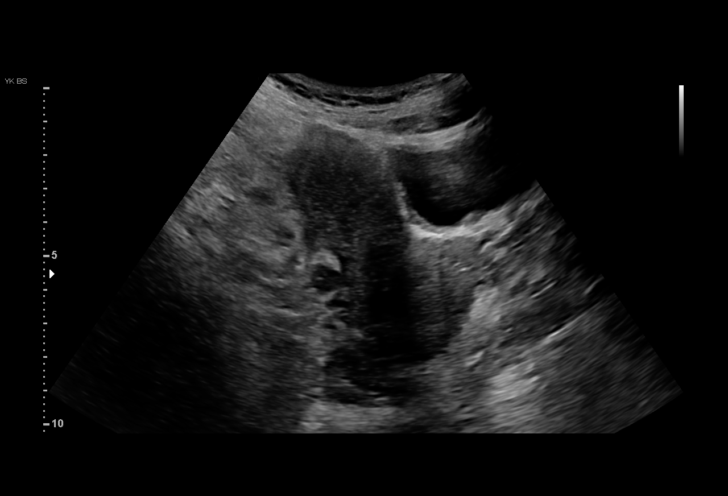
[im 4/51]
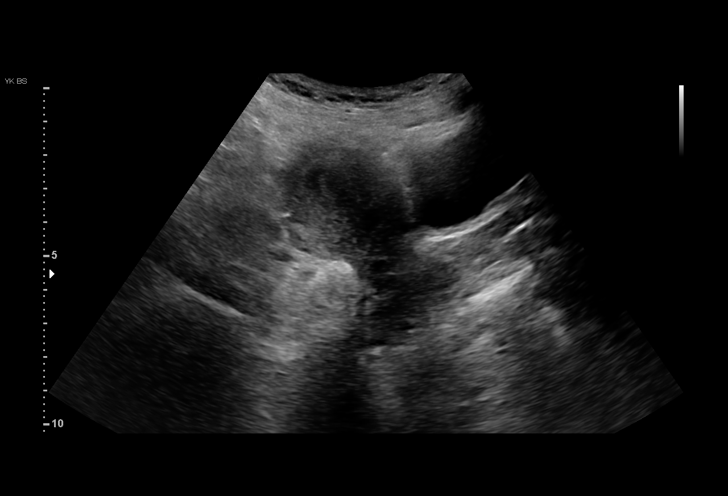
[im 8/51]
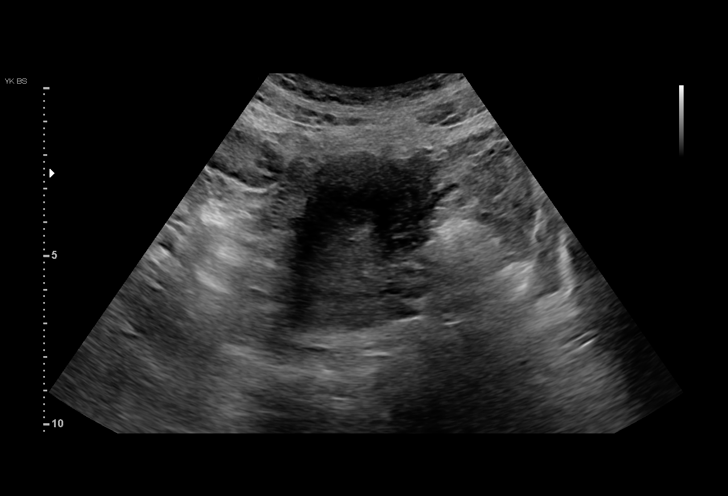
[im 12/51]
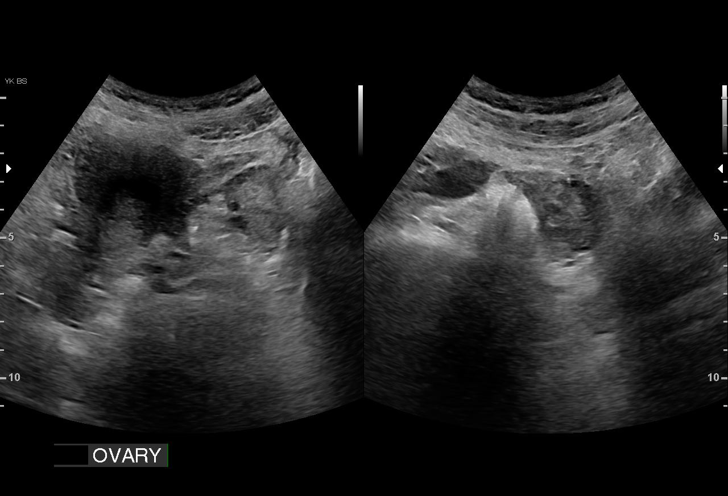
[im 15/51]
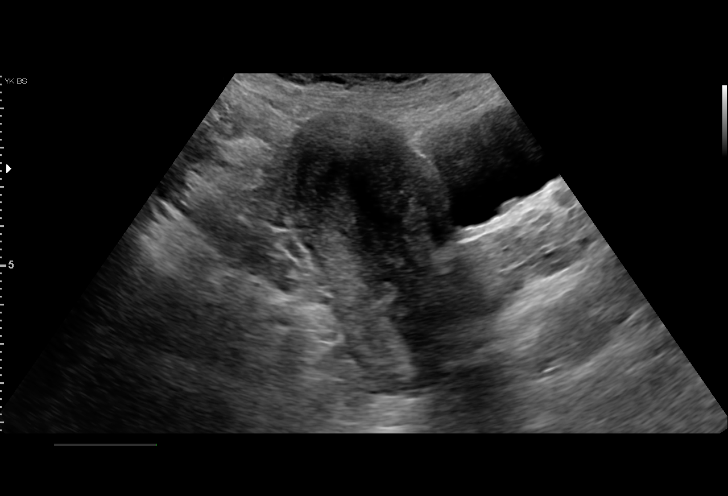
[im 19/51]
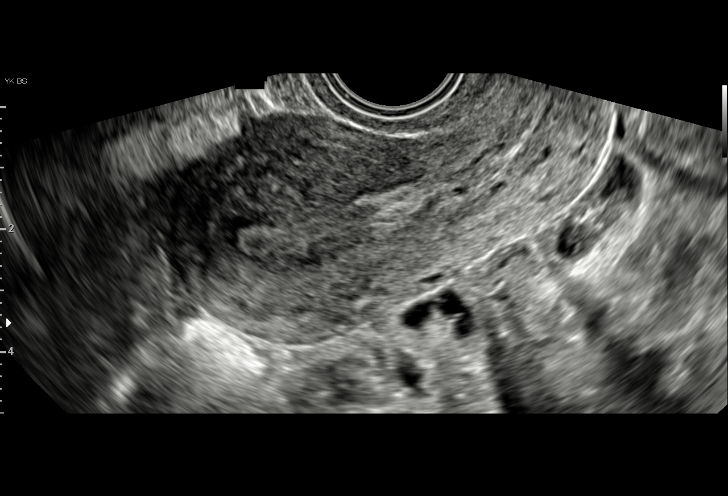
[im 23/51]
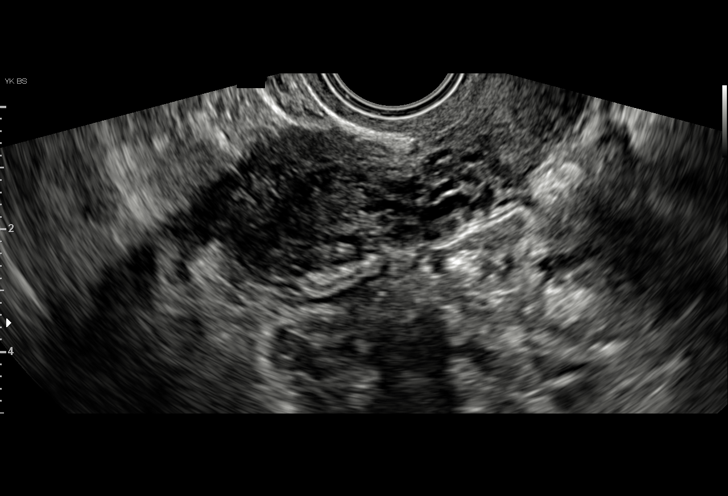
[im 26/51]
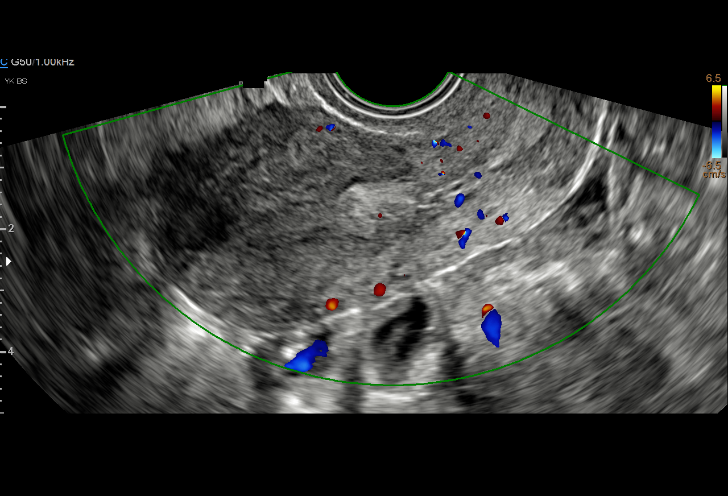
[im 28/51]
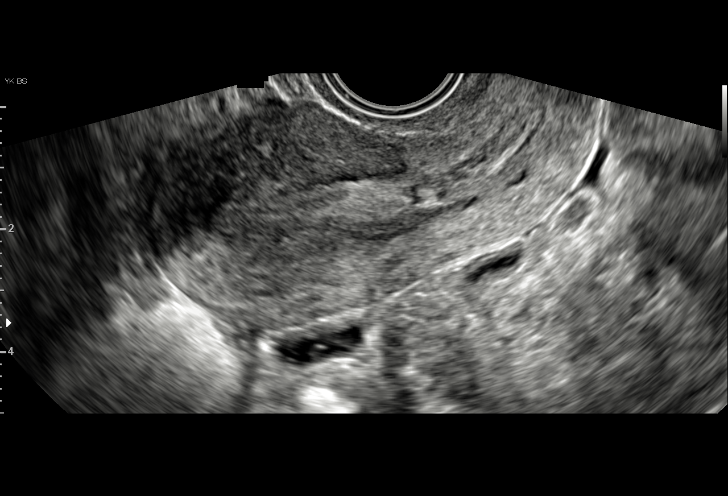
[im 32/51]
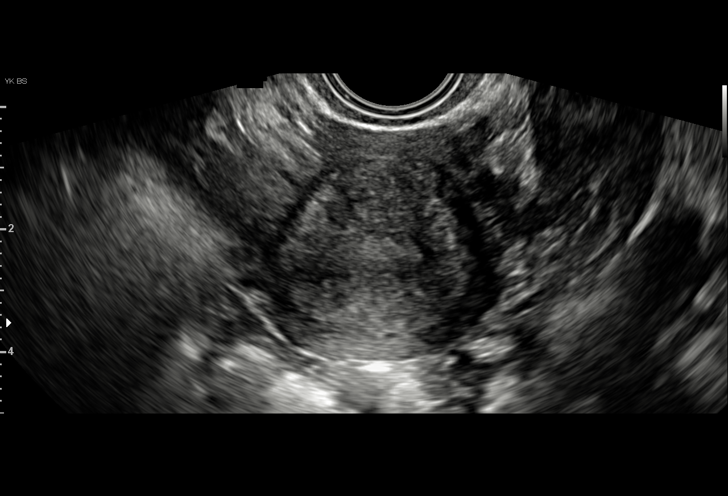
[im 36/51]
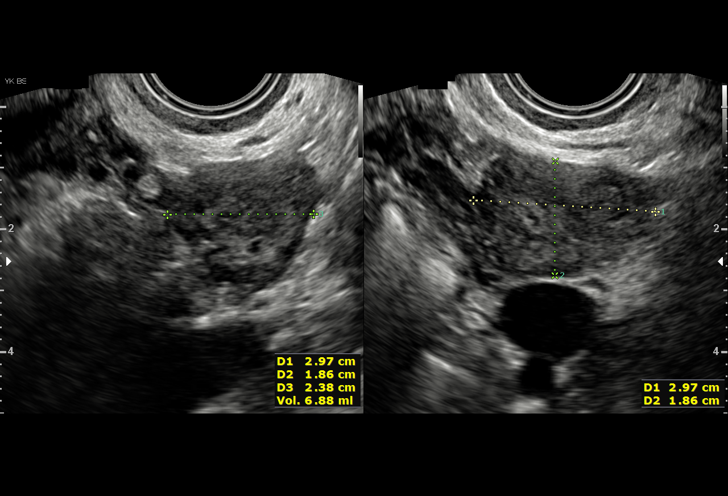
[im 39/51]
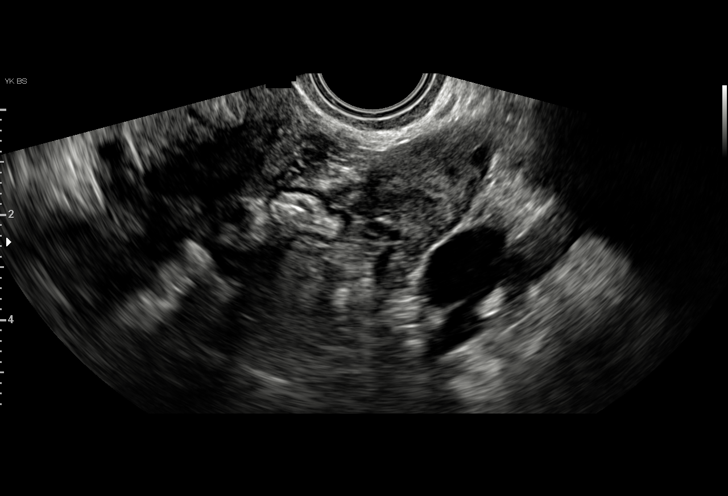
[im 43/51]
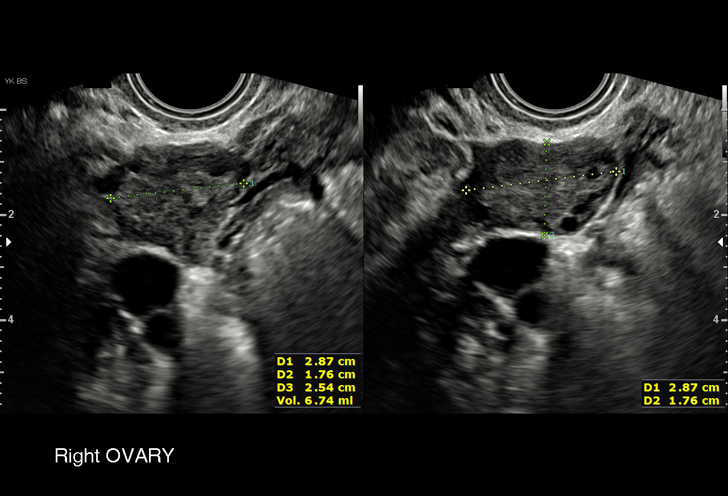
[im 47/51]
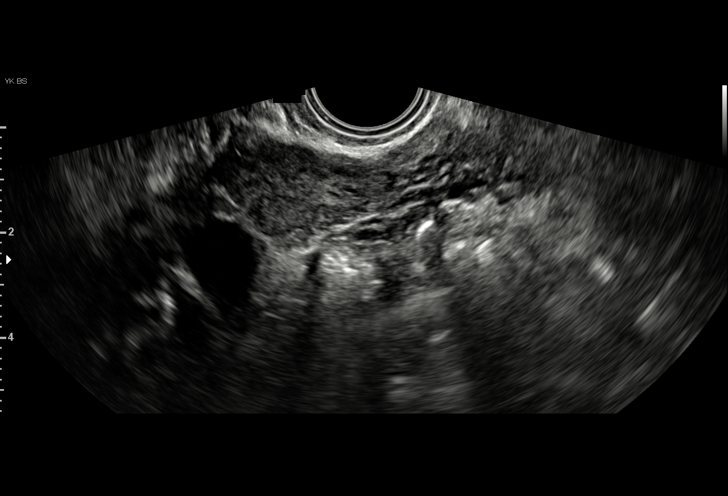
[im 51/51]
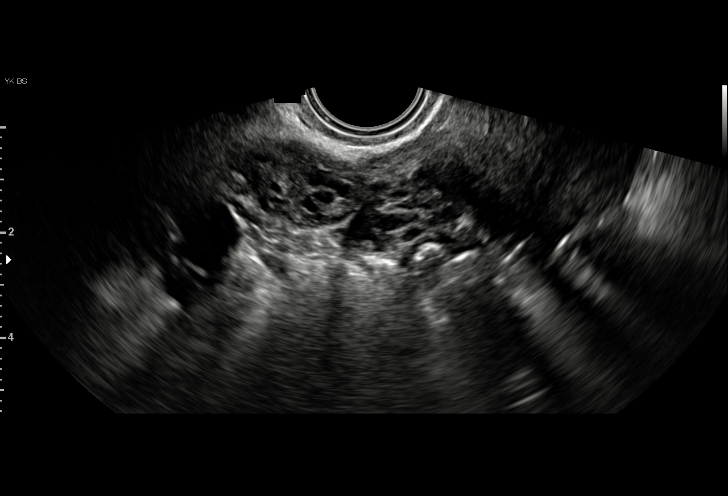

[15 of 28 positions shown; findings below may reference images not displayed]

FINDINGS: Intrauterine gestational sac: None

Yolk sac:  Not Visualized.

Embryo:  Not Visualized.

Maternal uterus/adnexae: The ovaries bilaterally are unremarkable.

No adnexal mass or free fluid.
IMPRESSION: No IUP or adnexal mass visualized. Differential diagnosis includes
recent spontaneous abortion, IUP too early to visualize, and occult
ectopic pregnancy. Recommend close follow up of quantitative B-HCG
levels, and follow up US as clinically warranted.

## 2024-07-24 ENCOUNTER — Other Ambulatory Visit: Payer: Self-pay | Admitting: Family Medicine

## 2024-07-24 MED ORDER — OSELTAMIVIR PHOSPHATE 75 MG PO CAPS: 75.0000 mg | ORAL_CAPSULE | Freq: Two times a day (BID) | ORAL | 0 refills | Status: AC
